# Patient Record
Sex: Female | Born: 1985 | Race: Black or African American | Hispanic: No | Marital: Single | Smoking: Current every day smoker
Health system: Southern US, Community
[De-identification: ages and names within clinical notes are randomized; demographics above are authoritative.]

## PROBLEM LIST (undated history)

## (undated) ENCOUNTER — Inpatient Hospital Stay (HOSPITAL_COMMUNITY): Payer: Self-pay

## (undated) DIAGNOSIS — I10 Essential (primary) hypertension: Secondary | ICD-10-CM

## (undated) DIAGNOSIS — D649 Anemia, unspecified: Secondary | ICD-10-CM

## (undated) DIAGNOSIS — A599 Trichomoniasis, unspecified: Secondary | ICD-10-CM

## (undated) HISTORY — PX: WISDOM TOOTH EXTRACTION: SHX21

## (undated) HISTORY — DX: Anemia, unspecified: D64.9

## (undated) HISTORY — PX: NO PAST SURGERIES: SHX2092

## (undated) HISTORY — DX: Essential (primary) hypertension: I10

## (undated) HISTORY — DX: Trichomoniasis, unspecified: A59.9

---

## 2010-01-21 NOTE — L&D Delivery Note (Addendum)
Delivery Note At  a non-viable female sex was delivered via  En caul.  APGAR: 0,0 ; weight to be determined   Placenta status:  Intact Patient delivered intact the placenta with membranes surrounding baby.  Baby had nuchal x 1 but otherwise had no maceration, consistent with very recent fetal demise. Patient with lower blood counts now and platelet count is also dropping consistent with possible DIC.  Will transfuse 2u PRBC, keep 2 available.  So far have given Methergine 0.2 mg IM, Cytotec 800 micrograms rectally, and now Hemabate 4 amps(1 mg total) IM    May need to transfuse platelets and coag factors if bleeding does not abate.  Blood pressure 131/71, pulse 66, temperature 98.3 F (36.8 C), temperature source Oral, resp. rate 20, height 5\' 5"  (1.651 m), weight 58.786 kg (129 lb 9.6 oz).   Basename 08/20/10 0137 08/19/10 1749  HGB 6.7* 8.2*  HCT 19.2* 23.8*  PLT 72* 110*       Anesthesia: None  Episiotomy: none  Lacerations: none Suture Repair: none Est. Blood Loss (mL): 500 cc retroplacental clot, 250 cc after delivery of fetus  Mom to 3rd floor.  Baby to NA.  Emmette Katt H 08/20/2010, 2:34 AM

## 2010-08-19 ENCOUNTER — Inpatient Hospital Stay (HOSPITAL_COMMUNITY)
Admission: AD | Admit: 2010-08-19 | Discharge: 2010-08-22 | DRG: 774 | Disposition: A | Discharge status: Home / Self Care | Payer: Medicaid Other | Source: Ambulatory Visit | Attending: Obstetrics & Gynecology | Admitting: Obstetrics & Gynecology

## 2010-08-19 ENCOUNTER — Encounter (HOSPITAL_COMMUNITY): Payer: Self-pay | Admitting: *Deleted

## 2010-08-19 ENCOUNTER — Inpatient Hospital Stay (HOSPITAL_COMMUNITY): Payer: Medicaid Other

## 2010-08-19 DIAGNOSIS — O9912 Other diseases of the blood and blood-forming organs and certain disorders involving the immune mechanism complicating childbirth: Secondary | ICD-10-CM | POA: Diagnosis present

## 2010-08-19 DIAGNOSIS — O4590 Premature separation of placenta, unspecified, unspecified trimester: Secondary | ICD-10-CM | POA: Diagnosis present

## 2010-08-19 DIAGNOSIS — O364XX0 Maternal care for intrauterine death, not applicable or unspecified: Principal | ICD-10-CM | POA: Diagnosis present

## 2010-08-19 DIAGNOSIS — O0930 Supervision of pregnancy with insufficient antenatal care, unspecified trimester: Secondary | ICD-10-CM

## 2010-08-19 DIAGNOSIS — O9903 Anemia complicating the puerperium: Secondary | ICD-10-CM | POA: Diagnosis not present

## 2010-08-19 DIAGNOSIS — D649 Anemia, unspecified: Secondary | ICD-10-CM | POA: Diagnosis not present

## 2010-08-19 DIAGNOSIS — D689 Coagulation defect, unspecified: Secondary | ICD-10-CM | POA: Diagnosis present

## 2010-08-19 DIAGNOSIS — D65 Disseminated intravascular coagulation [defibrination syndrome]: Secondary | ICD-10-CM | POA: Diagnosis present

## 2010-08-19 DIAGNOSIS — N939 Abnormal uterine and vaginal bleeding, unspecified: Secondary | ICD-10-CM

## 2010-08-19 LAB — WET PREP, GENITAL

## 2010-08-19 LAB — DIFFERENTIAL
Basophils Absolute: 0 10*3/uL (ref 0.0–0.1)
Lymphs Abs: 0.8 10*3/uL (ref 0.7–4.0)
Monocytes Absolute: 1 10*3/uL (ref 0.1–1.0)
WBC Morphology: INCREASED

## 2010-08-19 LAB — CBC
MCH: 32.2 pg (ref 26.0–34.0)
MCHC: 34.5 g/dL (ref 30.0–36.0)
MCV: 93.3 fL (ref 78.0–100.0)
Platelets: 110 10*3/uL — ABNORMAL LOW (ref 150–400)
RDW: 12.8 % (ref 11.5–15.5)

## 2010-08-19 LAB — URINALYSIS, ROUTINE W REFLEX MICROSCOPIC
Bilirubin Urine: NEGATIVE
Ketones, ur: 15 mg/dL — AB
Nitrite: NEGATIVE
Specific Gravity, Urine: 1.025 (ref 1.005–1.030)
Urobilinogen, UA: 1 mg/dL (ref 0.0–1.0)

## 2010-08-19 LAB — ABO/RH
ABO/RH(D): O POS
RH Type: POSITIVE

## 2010-08-19 LAB — RPR
RPR Ser Ql: NONREACTIVE
RPR: NONREACTIVE

## 2010-08-19 LAB — RAPID URINE DRUG SCREEN, HOSP PERFORMED: Amphetamines: NOT DETECTED

## 2010-08-19 LAB — URINE MICROSCOPIC-ADD ON

## 2010-08-19 MED ORDER — LIDOCAINE HCL (PF) 1 % IJ SOLN
30.0000 mL | INTRAMUSCULAR | Status: DC | PRN
  Filled 2010-08-19: qty 30

## 2010-08-19 MED ORDER — MISOPROSTOL 200 MCG PO TABS
200.0000 ug | ORAL_TABLET | ORAL | Status: DC
  Administered 2010-08-19 – 2010-08-20 (×2): 200 ug via VAGINAL
  Filled 2010-08-19 (×10): qty 1

## 2010-08-19 MED ORDER — LACTATED RINGERS IV SOLN
INTRAVENOUS | Status: DC
  Administered 2010-08-19 – 2010-08-21 (×5): via INTRAVENOUS

## 2010-08-19 MED ORDER — CITRIC ACID-SODIUM CITRATE 334-500 MG/5ML PO SOLN: 30.0000 mL | ORAL | Status: DC | PRN

## 2010-08-19 MED ORDER — HYDROXYZINE HCL 50 MG PO TABS: 50.0000 mg | ORAL_TABLET | Freq: Four times a day (QID) | ORAL | Status: DC | PRN

## 2010-08-19 MED ORDER — FENTANYL CITRATE 0.05 MG/ML IJ SOLN
100.0000 ug | INTRAMUSCULAR | Status: DC | PRN
  Administered 2010-08-19 – 2010-08-20 (×4): 100 ug via INTRAVENOUS
  Filled 2010-08-19 (×4): qty 2

## 2010-08-19 MED ORDER — HYDROXYZINE HCL 50 MG/ML IM SOLN
50.0000 mg | Freq: Four times a day (QID) | INTRAMUSCULAR | Status: DC | PRN
  Filled 2010-08-19: qty 1

## 2010-08-19 MED ORDER — ONDANSETRON HCL 4 MG/2ML IJ SOLN
4.0000 mg | Freq: Four times a day (QID) | INTRAMUSCULAR | Status: DC | PRN
  Administered 2010-08-20: 4 mg via INTRAVENOUS
  Filled 2010-08-19: qty 2

## 2010-08-19 MED ORDER — LACTATED RINGERS IV SOLN: 500.0000 mL | INTRAVENOUS | Status: DC | PRN

## 2010-08-19 MED ORDER — FLEET ENEMA 7-19 GM/118ML RE ENEM: 1.0000 | ENEMA | RECTAL | Status: DC | PRN

## 2010-08-19 MED ORDER — FENTANYL CITRATE 0.05 MG/ML IJ SOLN: 100.0000 ug | INTRAMUSCULAR | Status: DC | PRN

## 2010-08-19 MED ORDER — OXYCODONE-ACETAMINOPHEN 5-325 MG PO TABS
2.0000 | ORAL_TABLET | ORAL | Status: DC | PRN
  Administered 2010-08-21: 2 via ORAL

## 2010-08-19 MED ORDER — OXYTOCIN 20 UNITS IN LACTATED RINGERS INFUSION - SIMPLE
125.0000 mL/h | Freq: Once | INTRAVENOUS | Status: DC
  Administered 2010-08-20: 500 mL/h via INTRAVENOUS
  Filled 2010-08-19: qty 1000

## 2010-08-19 MED ORDER — ACETAMINOPHEN 325 MG PO TABS
650.0000 mg | ORAL_TABLET | ORAL | Status: DC | PRN
  Filled 2010-08-19 (×3): qty 2

## 2010-08-19 MED ORDER — IBUPROFEN 600 MG PO TABS
600.0000 mg | ORAL_TABLET | Freq: Four times a day (QID) | ORAL | Status: DC | PRN
  Administered 2010-08-22: 600 mg via ORAL
  Filled 2010-08-19: qty 1

## 2010-08-19 NOTE — Progress Notes (Signed)
  Cytotec 250 micrograms placed in vagina  cx still about the same  Bleeding not heavy  Continue current care plan.  Lazaro Arms 08/19/2010 8:53 PM

## 2010-08-19 NOTE — Progress Notes (Signed)
Pt c/o lg amt of bleeding that was running down her legs about 1 hr ago.   Pt also c/o severe LLQ pain that radiates around to low back

## 2010-08-19 NOTE — Progress Notes (Signed)
Pain medication follow-up
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
## 2010-08-19 NOTE — H&P (Signed)
Regina Newman is a 25 y.o. female G1P1001 with IUP at Unknown presenting with c/o severe lower abdominal pain and heavy bleeding (ran down her legs) since this morning. Pt states she has been having severe pain all day but bleeding started recently.  Did not know she was pregnant, but suspected she was. Did not do a Preg test or get care "because I didn't want anyone to know".  No fever or other symptoms. Denies significant medical history.     Prenatal Course Source of Care:none   Past Medical History: No past medical history on file.  Past Surgical History:  Past Surgical History  Procedure Date  . No past surgeries     Obstetrical History:  OB History    Grav Para Term Preterm Abortions TAB SAB Ect Mult Living   1 1 1       1       Social History:  History   Social History  . Marital Status: Single    Spouse Name: N/A    Number of Children: N/A  . Years of Education: N/A   Social History Main Topics  . Smoking status: Current Everyday Smoker -- 0.2 packs/day  . Smokeless tobacco: None  . Alcohol Use: Yes     few days a week  . Drug Use: Yes    Special: Marijuana  . Sexually Active:    Other Topics Concern  . None   Social History Narrative  . None    Family History: History reviewed. No pertinent family history.  Medications:  Prenatal vitamins,  No current facility-administered medications for this encounter.    Allergies: No Known Allergies  Review of Systems: - Negative except vaginal bleeding and abdominal pain  Physical Exam: Blood pressure 143/86, pulse 85, temperature 99.2 F (37.3 C), temperature source Oral, resp. rate 20, last menstrual period 04/22/2010. General: NAD Heart: RRR, no murmurs Lungs: CTA b/l  Abd: Gravid in contour, rigid and tender;   Fundal height about 22-24cm Ext: no edema Neuro: DTRs normal Other:   Speculum Exam: Moderate to large blood in vault.  Cultures obtained Digital Cervical Exam: Dilatation 1+   Effacement  80%  Station -3  Presentation ?vtx FHR:  Baseline rate 0    Contractions: Frequent   Blood pressure 143/86, pulse 85, temperature 99.2 F (37.3 C), temperature source Oral, resp. rate 20, last menstrual period 04/22/2010.  unsure   Dilation: 1 Effacement (%): 80 Station: -3 Exam by:: Artelia Laroche CNM  Pertinent Labs/Studies:  Pending Bedside U/S:  IUFD with large placental abruption (10 cm) Assessment : Regina Newman is a 25 y.o. female presenting for Heavy vaginal bleeding and abdominal pain IUP @ approximately 23 weeks IUFD with placental abruption  Plan: Admit to [facility] , Dr Despina Hidden notified Routine orders, Full labs + DIC Induction of Labor Anticipate SVD   [facility] 08/19/2010, 5:51 PM

## 2010-08-20 ENCOUNTER — Other Ambulatory Visit: Payer: Self-pay | Admitting: Obstetrics & Gynecology

## 2010-08-20 ENCOUNTER — Encounter (HOSPITAL_COMMUNITY): Payer: Self-pay | Admitting: *Deleted

## 2010-08-20 ENCOUNTER — Encounter (HOSPITAL_COMMUNITY): Payer: Self-pay | Admitting: Anesthesiology

## 2010-08-20 DIAGNOSIS — D689 Coagulation defect, unspecified: Secondary | ICD-10-CM

## 2010-08-20 DIAGNOSIS — D65 Disseminated intravascular coagulation [defibrination syndrome]: Secondary | ICD-10-CM

## 2010-08-20 DIAGNOSIS — O4590 Premature separation of placenta, unspecified, unspecified trimester: Secondary | ICD-10-CM

## 2010-08-20 DIAGNOSIS — O9912 Other diseases of the blood and blood-forming organs and certain disorders involving the immune mechanism complicating childbirth: Secondary | ICD-10-CM

## 2010-08-20 DIAGNOSIS — O364XX0 Maternal care for intrauterine death, not applicable or unspecified: Secondary | ICD-10-CM

## 2010-08-20 LAB — CBC
HCT: 19.2 % — ABNORMAL LOW (ref 36.0–46.0)
HCT: 23.4 % — ABNORMAL LOW (ref 36.0–46.0)
HCT: 22.7 % — ABNORMAL LOW (ref 36.0–46.0)
Hemoglobin: 6.7 g/dL — CL (ref 12.0–15.0)
Hemoglobin: 8 g/dL — ABNORMAL LOW (ref 12.0–15.0)
Hemoglobin: 7.8 g/dL — ABNORMAL LOW (ref 12.0–15.0)
MCH: 32.4 pg (ref 26.0–34.0)
MCH: 30.5 pg (ref 26.0–34.0)
MCHC: 34.9 g/dL (ref 30.0–36.0)
MCHC: 34.2 g/dL (ref 30.0–36.0)
MCHC: 34.4 g/dL (ref 30.0–36.0)
MCV: 92.8 fL (ref 78.0–100.0)
RBC: 2.62 MIL/uL — ABNORMAL LOW (ref 3.87–5.11)

## 2010-08-20 LAB — COMPREHENSIVE METABOLIC PANEL
Albumin: 2.3 g/dL — ABNORMAL LOW (ref 3.5–5.2)
BUN: 3 mg/dL — ABNORMAL LOW (ref 6–23)
Creatinine, Ser: <0.47 mg/dL — ABNORMAL LOW (ref 0.50–1.10)
Potassium: 3.2 mEq/L — ABNORMAL LOW (ref 3.5–5.1)
Total Protein: 5.1 g/dL — ABNORMAL LOW (ref 6.0–8.3)

## 2010-08-20 LAB — RAPID HIV SCREEN (WH-MAU): Rapid HIV Screen: NONREACTIVE

## 2010-08-20 LAB — GC/CHLAMYDIA PROBE AMP, GENITAL
Chlamydia, DNA Probe: NEGATIVE
GC Probe Amp, Genital: NEGATIVE

## 2010-08-20 LAB — PREPARE RBC (CROSSMATCH)

## 2010-08-20 LAB — DIC (DISSEMINATED INTRAVASCULAR COAGULATION)PANEL
D-Dimer, Quant: >20 ug/mL-FEU — ABNORMAL HIGH (ref 0.00–0.48)
D-Dimer, Quant: 5.2 ug/mL-FEU — ABNORMAL HIGH (ref 0.00–0.48)
INR: 1.13 (ref 0.00–1.49)
Platelets: 62 10*3/uL — ABNORMAL LOW (ref 150–400)
Platelets: 53 10*3/uL — ABNORMAL LOW (ref 150–400)
aPTT: 30 seconds (ref 24–37)

## 2010-08-20 MED ORDER — LACTATED RINGERS IV SOLN: 500.0000 mL | Freq: Once | INTRAVENOUS | Status: DC

## 2010-08-20 MED ORDER — IBUPROFEN 600 MG PO TABS
600.0000 mg | ORAL_TABLET | Freq: Four times a day (QID) | ORAL | Status: DC
  Administered 2010-08-21: 600 mg via ORAL
  Filled 2010-08-20: qty 1

## 2010-08-20 MED ORDER — METHYLERGONOVINE MALEATE 0.2 MG/ML IJ SOLN
INTRAMUSCULAR | Status: AC
  Administered 2010-08-20: 0.2 mg
  Filled 2010-08-20: qty 1

## 2010-08-20 MED ORDER — ZOLPIDEM TARTRATE 5 MG PO TABS
5.0000 mg | ORAL_TABLET | Freq: Every evening | ORAL | Status: DC | PRN
  Administered 2010-08-22: 5 mg via ORAL
  Filled 2010-08-20: qty 1

## 2010-08-20 MED ORDER — DIPHENHYDRAMINE HCL 50 MG/ML IJ SOLN
25.0000 mg | Freq: Once | INTRAMUSCULAR | Status: AC
  Administered 2010-08-20: 25 mg via INTRAVENOUS

## 2010-08-20 MED ORDER — SODIUM CHLORIDE 0.9 % IV SOLN
INTRAVENOUS | Status: DC
  Administered 2010-08-20: 10:00:00 via INTRAVENOUS

## 2010-08-20 MED ORDER — METHYLERGONOVINE MALEATE 0.2 MG/ML IJ SOLN: 0.2000 mg | INTRAMUSCULAR | Status: DC | PRN

## 2010-08-20 MED ORDER — METHYLERGONOVINE MALEATE 0.2 MG PO TABS: 0.2000 mg | ORAL_TABLET | ORAL | Status: DC | PRN

## 2010-08-20 MED ORDER — OXYTOCIN 20 UNITS IN LACTATED RINGERS INFUSION - SIMPLE: 125.0000 mL/h | INTRAVENOUS | Status: DC

## 2010-08-20 MED ORDER — FUROSEMIDE 10 MG/ML IJ SOLN
20.0000 mg | Freq: Once | INTRAMUSCULAR | Status: AC
  Administered 2010-08-20: 20 mg via INTRAVENOUS
  Filled 2010-08-20: qty 2

## 2010-08-20 MED ORDER — DIPHENOXYLATE-ATROPINE 2.5-0.025 MG PO TABS
2.0000 | ORAL_TABLET | ORAL | Status: AC | PRN
  Administered 2010-08-20: 2 via ORAL
  Filled 2010-08-20: qty 2

## 2010-08-20 MED ORDER — ACETAMINOPHEN 325 MG PO TABS
650.0000 mg | ORAL_TABLET | Freq: Once | ORAL | Status: AC
  Administered 2010-08-20: 650 mg via ORAL

## 2010-08-20 MED ORDER — BENZOCAINE-MENTHOL 20-0.5 % EX AERO: 1.0000 "application " | INHALATION_SPRAY | CUTANEOUS | Status: DC | PRN

## 2010-08-20 MED ORDER — PHENYLEPHRINE 40 MCG/ML (10ML) SYRINGE FOR IV PUSH (FOR BLOOD PRESSURE SUPPORT)
80.0000 ug | PREFILLED_SYRINGE | INTRAVENOUS | Status: DC | PRN
  Filled 2010-08-20: qty 5

## 2010-08-20 MED ORDER — SODIUM CHLORIDE 0.9 % IV SOLN: INTRAVENOUS | Status: DC

## 2010-08-20 MED ORDER — DIPHENHYDRAMINE HCL 50 MG/ML IJ SOLN
12.5000 mg | INTRAMUSCULAR | Status: DC | PRN
  Filled 2010-08-20: qty 1

## 2010-08-20 MED ORDER — WITCH HAZEL-GLYCERIN EX PADS: MEDICATED_PAD | CUTANEOUS | Status: DC | PRN

## 2010-08-20 MED ORDER — OXYTOCIN 20 UNITS IN LACTATED RINGERS INFUSION - SIMPLE
250.0000 mL/h | INTRAVENOUS | Status: DC
  Administered 2010-08-20: 250 mL/h via INTRAVENOUS
  Administered 2010-08-20: 500 mL/h via INTRAVENOUS
  Administered 2010-08-20: 250 mL/h via INTRAVENOUS
  Filled 2010-08-20 (×4): qty 1000

## 2010-08-20 MED ORDER — TETANUS-DIPHTH-ACELL PERTUSSIS 5-2.5-18.5 LF-MCG/0.5 IM SUSP
0.5000 mL | Freq: Once | INTRAMUSCULAR | Status: AC
  Administered 2010-08-21: 0.5 mL via INTRAMUSCULAR
  Filled 2010-08-20: qty 0.5

## 2010-08-20 MED ORDER — PRENATAL PLUS 27-1 MG PO TABS
1.0000 | ORAL_TABLET | Freq: Every day | ORAL | Status: DC
  Administered 2010-08-22: 1 via ORAL
  Filled 2010-08-20: qty 1

## 2010-08-20 MED ORDER — DIPHENHYDRAMINE HCL 25 MG PO CAPS: 25.0000 mg | ORAL_CAPSULE | Freq: Four times a day (QID) | ORAL | Status: DC | PRN

## 2010-08-20 MED ORDER — SENNOSIDES-DOCUSATE SODIUM 8.6-50 MG PO TABS
1.0000 | ORAL_TABLET | Freq: Every day | ORAL | Status: DC
  Administered 2010-08-21: 1 via ORAL

## 2010-08-20 MED ORDER — ACETAMINOPHEN 650 MG RE SUPP
650.0000 mg | Freq: Once | RECTAL | Status: AC
  Administered 2010-08-20: 650 mg via RECTAL
  Filled 2010-08-20: qty 1

## 2010-08-20 MED ORDER — CARBOPROST TROMETHAMINE 250 MCG/ML IM SOLN
1000.0000 ug | Freq: Once | INTRAMUSCULAR | Status: AC
  Administered 2010-08-20: 1000 ug via INTRAMUSCULAR
  Filled 2010-08-20: qty 4

## 2010-08-20 MED ORDER — MISOPROSTOL 200 MCG PO TABS
800.0000 ug | ORAL_TABLET | Freq: Once | ORAL | Status: AC
  Administered 2010-08-20: 800 ug via RECTAL

## 2010-08-20 MED ORDER — DIPHENHYDRAMINE HCL 25 MG PO CAPS
25.0000 mg | ORAL_CAPSULE | Freq: Once | ORAL | Status: AC
  Administered 2010-08-20: 25 mg via ORAL
  Filled 2010-08-20: qty 1

## 2010-08-20 MED ORDER — SIMETHICONE 80 MG PO CHEW: 80.0000 mg | CHEWABLE_TABLET | ORAL | Status: DC | PRN

## 2010-08-20 MED ORDER — OXYCODONE-ACETAMINOPHEN 5-325 MG PO TABS
1.0000 | ORAL_TABLET | ORAL | Status: DC | PRN
  Administered 2010-08-22: 2 via ORAL
  Administered 2010-08-22 (×2): 1 via ORAL
  Filled 2010-08-20: qty 1
  Filled 2010-08-20 (×2): qty 2
  Filled 2010-08-20: qty 1

## 2010-08-20 MED ORDER — ONDANSETRON HCL 4 MG/2ML IJ SOLN: 4.0000 mg | INTRAMUSCULAR | Status: DC | PRN

## 2010-08-20 MED ORDER — ONDANSETRON HCL 4 MG PO TABS
4.0000 mg | ORAL_TABLET | ORAL | Status: DC | PRN
  Administered 2010-08-21 – 2010-08-22 (×2): 4 mg via ORAL
  Filled 2010-08-20 (×2): qty 1

## 2010-08-20 MED ORDER — FENTANYL 2.5 MCG/ML BUPIVACAINE 1/10 % EPIDURAL INFUSION (WH - ANES): 14.0000 mL/h | INTRAMUSCULAR | Status: DC

## 2010-08-20 MED ORDER — ACETAMINOPHEN 325 MG PO TABS: 650.0000 mg | ORAL_TABLET | Freq: Once | ORAL | Status: DC

## 2010-08-20 MED ORDER — EPHEDRINE 5 MG/ML INJ
10.0000 mg | INTRAVENOUS | Status: DC | PRN
  Filled 2010-08-20: qty 4

## 2010-08-20 NOTE — Progress Notes (Signed)
Comfort measures provided. Patient told to call if needed.

## 2010-08-20 NOTE — Progress Notes (Signed)
Dr. Despina Hidden notified of patients increasing temperature. No new orders received at this time.

## 2010-08-20 NOTE — Anesthesia Preprocedure Evaluation (Signed)
Anesthesia Evaluation  Name, MR# and DOB Patient awake  General Assessment Comment  Reviewed: Allergy & Precautions, H&P , Patient's Chart, lab work & pertinent test results and reviewed documented beta blocker date and time   Airway       Dental   Pulmonary      Cardiovascular    Neuro/PsychNegative Neurological ROS Negative Psych ROS  GI/Hepatic/Renal negative GI ROS, negative Liver ROS, and negative Renal ROS (+)       Endo/Other  Negative Endocrine ROS (+)   Abdominal   Musculoskeletal  Hematology Platelet count 110 - unknown etiology   Peds  Reproductive/Obstetrics (+) Pregnancy (23 week IUFD, no prenatal care)   Anesthesia Other Findings Smoker, +marijuana use 23 week IUFD, no prenatal care            Anesthesia Physical Anesthesia Plan  ASA: II  Anesthesia Plan: Epidural   Post-op Pain Management:    Induction:   Airway Management Planned:   Additional Equipment:   Intra-op Plan:   Post-operative Plan:   Informed Consent: I have reviewed the patients History and Physical, chart, labs and discussed the procedure including the risks, benefits and alternatives for the proposed anesthesia with the patient or authorized representative who has indicated his/her understanding and acceptance.     Plan Discussed with:   Anesthesia Plan Comments:         Anesthesia Quick Evaluation

## 2010-08-20 NOTE — Progress Notes (Signed)
Bleeding from uterus since 3 am has been minimal and uterus has been firm. Patient just now with 2nd unit of blood in, will check CBC now mostly to check platelet count.  DIC panel has returned and reveals what we knew was going on, a consumptive coagulopathy secondary to abruption and resorption of large retroplacental clot.   PT 19 PTT  37 Fibrinogen  59 D dimer  >20  A unit of cryoprecipitate has been ordered and will be transfused, but clinically for now pt is stable and DIC seems to be reversed.  Will transfuse platelets if less than 50k.  Regina Newman H 08/20/2010 6:34 AM

## 2010-08-20 NOTE — Progress Notes (Signed)
Patient without complaints. Bleeding minimal now for 5 hours.  Blood pressure 135/61, pulse 69, temperature 99.6 F (37.6 C), temperature source Axillary, resp. rate 20, height 5\' 5"  (1.651 m), weight 58.786 kg (129 lb 9.6 oz), SpO2 93.00%. UOP since lasix 500 cc.  Abdomen soft, uterus firm and nontender, no bleeding  Hemoglobin  Date/Time Value Range Status  08/20/2010  6:25 AM 8.0* 12.0-15.0 (g/dL) Final  04/13/4008  2:72 AM 6.7* 12.0-15.0 (g/dL) Final     REPEATED TO VERIFY     CRITICAL RESULT CALLED TO, READ BACK BY AND VERIFIED WITH:     DUPONT,M ON [id number] AT 0203 BY PATEL,S.     DELTA CHECK NOTED  08/19/2010  5:49 PM 8.2* 12.0-15.0 (g/dL) Final     HCT  Date/Time Value Range Status  08/20/2010  6:25 AM 23.4* 36.0-46.0 (%) Final  08/20/2010  1:37 AM 19.2* 36.0-46.0 (%) Final  08/19/2010  5:49 PM 23.8* 36.0-46.0 (%) Final     Platelets  Date/Time Value Range Status  08/20/2010  6:25 AM 54* 150-400 (K/uL) Final     REPEATED TO VERIFY     PLATELET COUNT CONFIRMED BY SMEAR     CONSISTENT WITH PREVIOUS RESULT  08/20/2010  3:07 AM 62* 150-400 (K/uL) Final     REPEATED TO VERIFY     CONSISTENT WITH PREVIOUS RESULT  08/20/2010  1:37 AM 72* 150-400 (K/uL) Final     SPECIMEN CHECKED FOR CLOTS     REPEATED TO VERIFY     DELTA CHECK NOTED     PLATELET COUNT CONFIRMED BY SMEAR  08/19/2010  5:49 PM 110* 150-400 (K/uL) Final     SPECIMEN CHECKED FOR CLOTS     PLATELET COUNT CONFIRMED BY SMEAR     REPEATED TO VERIFY    Currently patient receiving her 10 units of cryoprecipitate.  Will transfuse 1 more unit PRBC.  Platelets are staying pretty steady  Assessment: 1.  IUFD secondary to abruption :  Delivered 2.  Large retroplacental clot + abruption leading to consumptive coagulopathy/DIC:  Process seems to have abated.  Status post 2 U PRBC, to get 1 more, getting 10 units of cryo for fibrinogen 59 etc. 3.  Will transfer to AICU with patient now more stable.

## 2010-08-20 NOTE — Progress Notes (Signed)
Regina Newman is a 25 y.o. G2P1001 at [redacted]w[redacted]d by ultrasound admitted for induction of labor due to IUFD.  Subjective: Low abd cramping  Objective: BP 141/79  Pulse 73  Temp(Src) 98.3 F (36.8 C) (Oral)  Resp 20  Ht 5\' 5"  (1.651 m)  Wt 58.786 kg (129 lb 9.6 oz)  BMI 21.57 kg/m2   I/O this shift: In: 2221.7 [P.O.:1680; I.V.:541.7] Out: 0    SVE:   2/80/parts in cervix/0  Labs: Lab Results  Component Value Date   WBC 20.1* 08/19/2010   HGB 8.2* 08/19/2010   HCT 23.8* 08/19/2010   MCV 93.3 08/19/2010   PLT 110* 08/19/2010    Assessment / Plan: IOL due to IUFD. Cytotec #2 placed vaginally  Labor: Progressing normally Preeclampsia:  labs stable Pain Control:  Fentanyl I/D:  n/a Anticipated MOD:  NSVD  Travell Desaulniers E. 08/20/2010, 12:26 AM

## 2010-08-20 NOTE — Progress Notes (Signed)
Dr Despina Hidden remains at bedside, lab calls and gives criticial lab values, md made aware, md to order type and screen and cross match

## 2010-08-21 LAB — RPR: RPR Ser Ql: NONREACTIVE

## 2010-08-21 LAB — PREPARE CRYOPRECIPITATE
Unit division: 0
Unit division: 0
Unit division: 0
Unit division: 0
Unit division: 0

## 2010-08-21 LAB — TYPE AND SCREEN
Unit division: 0
Unit division: 0

## 2010-08-21 LAB — CBC
HCT: 21.4 % — ABNORMAL LOW (ref 36.0–46.0)
Hemoglobin: 7.4 g/dL — ABNORMAL LOW (ref 12.0–15.0)
RBC: 2.39 MIL/uL — ABNORMAL LOW (ref 3.87–5.11)
WBC: 9.8 10*3/uL (ref 4.0–10.5)

## 2010-08-21 LAB — STREP B DNA PROBE: Strep Group B Ag: NEGATIVE

## 2010-08-21 MED ORDER — MISOPROSTOL 200 MCG PO TABS: 200.0000 ug | ORAL_TABLET | ORAL | Status: DC | PRN

## 2010-08-21 NOTE — Progress Notes (Signed)
Patient has good pain control, not much vaginal bleeding.  BP118/52  p 71  T 98.8  NAD, reserved affect  ABD  NT, FF  EXT no cords, not tender  HB 7.4  PLT 62  IMP/plan  PPD 2 s/p SVD, abruption, fetal demise                 Anemia, low platelets                  Will repeat CBC in AM.                 Transfer to [facility]

## 2010-08-21 NOTE — Anesthesia Procedure Notes (Signed)
Procedures

## 2010-08-21 NOTE — Progress Notes (Signed)
UR chart review completed.  

## 2010-08-21 NOTE — Progress Notes (Signed)
Spiritual Care - Referral from RN.  Visited at 9:30 am to offer emotional/spiritual support to patient with fetal demise.  Expressed by sorrow for her loss.  Patient was not very expressive,  She did not see nor hold her baby.  I explained need to make arrangements for the baby, and she said she is waiting for her sister to come and help her.  Seems to be very dependent on the sister.  I did review with her the options available as burial, cremation, service, etc.  She expects sister to come today and wants to wait to discuss this.  Dory Horn, Chaplain

## 2010-08-22 LAB — CBC
Hemoglobin: 7.6 g/dL — ABNORMAL LOW (ref 12.0–15.0)
MCH: 30 pg (ref 26.0–34.0)
MCV: 90.5 fL (ref 78.0–100.0)
RBC: 2.53 MIL/uL — ABNORMAL LOW (ref 3.87–5.11)

## 2010-08-22 MED ORDER — IBUPROFEN 800 MG PO TABS: 800.0000 mg | ORAL_TABLET | Freq: Three times a day (TID) | ORAL | Status: AC | PRN

## 2010-08-22 MED ORDER — DOCUSATE SODIUM 100 MG PO CAPS: 100.0000 mg | ORAL_CAPSULE | Freq: Two times a day (BID) | ORAL | Status: AC

## 2010-08-22 NOTE — Progress Notes (Signed)
  Post Partum Day 2, s/p IUFD Subjective: up ad lib, voiding, tolerating PO and some dizziness with walking, pain well controlled  Objective: Blood pressure 133/82, pulse 61, temperature 97.7 F (36.5 C), temperature source Oral, resp. rate 19, height 5\' 7"  (1.702 m), weight 136 lb (61.689 kg), SpO2 100.00%.  Physical Exam:  General: alert, cooperative and no distress Lochia: appropriate Uterine Fundus: firm, below umbilicus DVT Evaluation: No evidence of DVT seen on physical exam. Negative Homan's sign. Cardiac - RRR no murmurs Lungs - CTAB  Basename 08/22/10 0536 08/21/10 0550  HGB 7.6* 7.4*  HCT 22.9* 21.4*    Assessment/Plan: Discharge home, s/p IUFD with no PNC.  Will check a CBC today prior to discharge.   LOS: 3 days   Regina Newman 08/22/2010, 6:51 AM

## 2010-08-22 NOTE — Progress Notes (Signed)
SW met with pt this morning to offer resources and assist with funeral arrangements.  Pt was tearful and did not want to have discussion.  She asked that this SW to contact her sister, Halston Fairclough to discuss details.  SW offered emotional support to the pt and brochure for [employer] as a resource.  Pt is not interested in counseling at this time. SW spoke to pt's sister who expressed interest in having the infant cremated.  SW spoke with pt and she agrees with sister's decision.  SW called [employer] administrator and requested that paper work be sent for pt to complete.  This will be a free service to the family.  Pt advised that FOB will need to sign paper work as well, as per [place] at [employer].  Pt's is not interested in having a service or seeing the infant.  SW will complete paper work with pt, once received and request that infant be picked up.

## 2010-08-22 NOTE — Discharge Summary (Signed)
  Obstetric Discharge Summary Reason for Admission: Labor, IUFD at 23 weeks, no prenatal care Prenatal Procedures: ultrasound Intrapartum Procedures: spontaneous vaginal delivery Postpartum Procedures: transfusion 3 units PRBC, 1 unit cryoprecipitate Complications-Operative and Postpartum: hemorrhage Hemoglobin  Date Value Range Status  August 26, 2010 7.6* 12.0-15.0 (g/dL) Final     HCT  Date Value Range Status  August 26, 2010 22.9* 36.0-46.0 (%) Final     Discharge Diagnoses: Intrauterine fetal demise  Hospital Course: Patient presented with abdominal pain and bleeding and was found to have an IUFD with a retroplacental clot secondary to placental abruption.  She delivered at 23wk fetus with Apgars 0/0.  She hada post partum hemorrhage and required 0.2IM methergine, cytotec PR and 4 amps of hemabate.  Her Hgb dropped from 8.2 to 6.7 and her platelets dropped to 53.  She received 3 units PRBS and 1 unit of cryoprecipitate.  On PPD 2, her Hgb was stable at 7.6 and her platelets were 83.  She is unsure of contraception at this time and will follow up with [facility] clinic.  Discharge Information: Date: 08/26/10 Activity: pelvic rest Diet: routine Medications: Ibuprophen and Colace Condition: stable Instructions: refer to practice specific booklet Discharge to: home Follow-up Information    Follow up with [facility] in 1 week. (follow up appointment)    Contact information:   [address] [place] [address]          Newborn Data: Live born unspecified sex  Birth Weight: 1 lb 1.8 oz (505 g) APGAR: 0, 0  Infant deceased.   Marena Chancy 08/26/10, 6:58 AM

## 2010-08-22 NOTE — Progress Notes (Signed)
SW received paper work from [employer].  [facility] Candelaria Stagers is assisting pt with completion of paper work and will fax back to agency.

## 2010-08-22 NOTE — Progress Notes (Signed)
SW spoke with chaplin.  Paper work was completed and faxed.  SW will assist further if need prior to d/c.

## 2010-08-27 NOTE — Discharge Summary (Signed)
Agree with above note.  Romolo Sieling A 08/27/2010 1:16 PM

## 2011-10-02 ENCOUNTER — Emergency Department (INDEPENDENT_AMBULATORY_CARE_PROVIDER_SITE_OTHER)
Admission: EM | Admit: 2011-10-02 | Discharge: 2011-10-02 | Disposition: A | Discharge status: Home / Self Care | Payer: Medicaid Other | Source: Home / Self Care | Attending: Emergency Medicine | Admitting: Emergency Medicine

## 2011-10-02 ENCOUNTER — Encounter (HOSPITAL_COMMUNITY): Payer: Self-pay | Admitting: Emergency Medicine

## 2011-10-02 DIAGNOSIS — J020 Streptococcal pharyngitis: Secondary | ICD-10-CM

## 2011-10-02 MED ORDER — PENICILLIN V POTASSIUM 500 MG PO TABS: 500.0000 mg | ORAL_TABLET | Freq: Four times a day (QID) | ORAL | Status: AC

## 2011-10-02 MED ORDER — HYDROCODONE-ACETAMINOPHEN 7.5-500 MG/15ML PO SOLN: 15.0000 mL | Freq: Four times a day (QID) | ORAL | Status: AC | PRN

## 2011-10-02 NOTE — ED Notes (Signed)
Call from pharmacy to ; their stock item lortab contains 325 tylenol product instead of 500; per Dr Ladon Applebaum , substitution is alllowed

## 2011-10-02 NOTE — ED Provider Notes (Signed)
History     CSN: [id number]  Arrival date & time 10/02/11  1027   First MD Initiated Contact with Patient 10/02/11 1037      Chief Complaint  Patient presents with  . Sore Throat    (Consider location/radiation/quality/duration/timing/severity/associated sxs/prior treatment) HPI Comments: Patient presents urgent care complaining of a sore throat this started yesterday, very minor. But this morning she woke up with severe pain when she swallows and discomfort. Feels that maybe she has had a fever. Feeling no appetite at all and feeling somewhat tired. Patient denies any cough, congestion or shortness of breath. Is able to swallow fluids. She denies any abdominal pain nausea or vomiting.  Patient is a 26 y.o. female presenting with pharyngitis. The history is provided by the patient.  Sore Throat This is a recurrent problem. The current episode started less than 1 hour ago. The problem occurs constantly. Pertinent negatives include no headaches. Nothing aggravates the symptoms. Nothing relieves the symptoms. She has tried nothing for the symptoms. The treatment provided no relief.    History reviewed. No pertinent past medical history.  Past Surgical History  Procedure Date  . No past surgeries     No family history on file.  History  Substance Use Topics  . Smoking status: Current Every Day Smoker -- 0.2 packs/day for 10 years    Types: Cigarettes  . Smokeless tobacco: Not on file  . Alcohol Use: No     few days a week    OB History    Grav Para Term Preterm Abortions TAB SAB Ect Mult Living   2 2 1 1      1       Review of Systems  Constitutional: Positive for fever, activity change and appetite change. Negative for diaphoresis.  HENT: Positive for sore throat and trouble swallowing. Negative for ear pain, facial swelling, neck stiffness, dental problem and sinus pressure.   Eyes: Negative for pain.  Skin: Negative for rash and wound.  Neurological: Negative for  dizziness and headaches.    Allergies  Review of patient's allergies indicates no known allergies.  Home Medications   Current Outpatient Rx  Name Route Sig Dispense Refill  . ASPIRIN 81 MG PO TABS Oral Take 243 mg by mouth daily. For pain      . HYDROCODONE-ACETAMINOPHEN 7.5-500 MG/15ML PO SOLN Oral Take 15 mLs by mouth every 6 (six) hours as needed for pain. 120 mL 0  . PENICILLIN V POTASSIUM 500 MG PO TABS Oral Take 1 tablet (500 mg total) by mouth 4 (four) times daily. 40 tablet 0    BP 111/72  Pulse 90  Temp 98.4 F (36.9 C) (Oral)  Resp 18  SpO2 100%  LMP 08/22/2011  Physical Exam  Nursing note and vitals reviewed. Constitutional: Vital signs are normal. She appears well-developed.  Non-toxic appearance. She does not have a sickly appearance. She does not appear ill. She appears distressed.  HENT:  Head: Normocephalic.  Mouth/Throat: Uvula is midline and mucous membranes are normal. Oropharyngeal exudate and posterior oropharyngeal erythema present. No posterior oropharyngeal edema or tonsillar abscesses.  Eyes: Conjunctivae normal are normal.  Neck: Neck supple. No JVD present.  Pulmonary/Chest: Effort normal.  Lymphadenopathy:    She has cervical adenopathy.  Neurological: She is alert.  Skin: No rash noted. No erythema.    ED Course  Procedures (including critical care time)  Labs Reviewed  POCT RAPID STREP A ([facility] ONLY) - Abnormal; Notable for the following:  Streptococcus, Group A Screen (Direct) POSITIVE (*)     All other components within normal limits   No results found.   1. Strep pharyngitis     And  MDM  Uncomplicated streptococcal pharyngitis. No evidence of a parapharyngeal or tonsillar abscess. Patient has been started on penicillin to complete cycle for 10 days and was provided Lortab elixir to be taken with precautions every 6-8 hours as needed and to hydrate yourself well. Warning signs of difficulty swallowing or worsening  symptoms advised patient to go to the emergency department if worsening.        Jimmie Molly, MD 10/02/11 1133

## 2011-10-02 NOTE — ED Notes (Signed)
Woke this am with sore throat. 

## 2012-01-22 NOTE — L&D Delivery Note (Signed)
Delivery Note At 4:31 PM a viable female was delivered via Vaginal, Spontaneous Delivery (Presentation: ; Occiput Anterior).  APGAR: 9, 9; weight .   Placenta status: Intact, Spontaneous.  Cord: 3 vessels with the following complications: None.    Anesthesia: None  Episiotomy: None Lacerations: right and left labial lacerations-hemostatic with no repair needed. 1st degree perineal laceration also hemostatic with no repair needed  Est. Blood Loss (mL):  Mom to postpartum.  Baby to nursery-stable.  Alene Mires 03/25/2012, 4:50 PM

## 2012-01-22 NOTE — L&D Delivery Note (Signed)
I was present for this delivery and agree with the above midwife student's note.  LEFTWICH-KIRBY, LISA Certified Nurse-Midwife

## 2012-02-17 ENCOUNTER — Encounter (HOSPITAL_COMMUNITY): Payer: Self-pay

## 2012-02-17 ENCOUNTER — Encounter (HOSPITAL_COMMUNITY): Payer: Self-pay | Admitting: Advanced Practice Midwife

## 2012-02-17 ENCOUNTER — Inpatient Hospital Stay (HOSPITAL_COMMUNITY)
Admission: AD | Admit: 2012-02-17 | Discharge: 2012-02-17 | Disposition: A | Discharge status: Home / Self Care | Payer: Medicaid Other | Source: Ambulatory Visit | Attending: Obstetrics & Gynecology | Admitting: Obstetrics & Gynecology

## 2012-02-17 DIAGNOSIS — O09299 Supervision of pregnancy with other poor reproductive or obstetric history, unspecified trimester: Secondary | ICD-10-CM

## 2012-02-17 DIAGNOSIS — O99891 Other specified diseases and conditions complicating pregnancy: Secondary | ICD-10-CM | POA: Insufficient documentation

## 2012-02-17 DIAGNOSIS — O0930 Supervision of pregnancy with insufficient antenatal care, unspecified trimester: Secondary | ICD-10-CM | POA: Insufficient documentation

## 2012-02-17 NOTE — MAU Note (Signed)
Patient states her last period was in June. Has had no prenatal care. Wants to have an ultrasound to find out the sex of the baby. States she has a 25 year old with medical problems and she has not been able to see a doctor yet. Patient denies any pain, bleeding or leaking and feeling good fetal movement.

## 2012-02-17 NOTE — MAU Provider Note (Signed)
Attestation of Attending Supervision of Advanced Practitioner (CNM/NP): Evaluation and management procedures were performed by the Advanced Practitioner under my supervision and collaboration. I have reviewed the Advanced Practitioner's note and chart, and I agree with the management and plan.  Lamarco Gudiel H. 4:16 PM   

## 2012-02-17 NOTE — MAU Provider Note (Signed)
History     CSN: [id number]  Arrival date and time: 02/17/12 1009   First Provider Initiated Contact with Patient 02/17/12 1042      No chief complaint on file.  HPI This is a 27 y.o. female at about 32 weeks per LMP presents with request for ultrasound to see the gender of her baby. Has not had any prenatal care. Denies problems with pregnancy so far.  Does not think she is high risk, though she had a child 3 yrs ago with HLH (on chemo at [facility]) and then had a 2012 delivery of an IUFD at 23 weeks due to abruption with subsequent PPH.    RN Note: Patient states her last period was in June. Has had no prenatal care. Wants to have an ultrasound to find out the sex of the baby. States she has a 56 year old with medical problems and she has not been able to see a doctor yet. Patient denies any pain, bleeding or leaking and feeling good fetal movement.       OB History    Grav Para Term Preterm Abortions TAB SAB Ect Mult Living   3 2 1 1      1       No past medical history on file.  Past Surgical History  Procedure Date  . No past surgeries     No family history on file.  History  Substance Use Topics  . Smoking status: Current Every Day Smoker -- 0.2 packs/day for 10 years    Types: Cigarettes  . Smokeless tobacco: Not on file  . Alcohol Use: No     Comment: few days a week    Allergies: No Known Allergies  Prescriptions prior to admission  Medication Sig Dispense Refill  . [DISCONTINUED] aspirin 81 MG tablet Take 243 mg by mouth daily. For pain          Review of Systems  Constitutional: Negative for fever, chills and malaise/fatigue.  Gastrointestinal: Negative for nausea, vomiting, abdominal pain, diarrhea and constipation.  Genitourinary: Negative for dysuria.  Neurological: Negative for dizziness, weakness and headaches.   Physical Exam   Blood pressure 128/73, pulse 84, temperature 98.8 F (37.1 C), temperature source Oral, resp. rate 16, height 5'  6.5" (1.689 m), weight 66.497 kg (146 lb 9.6 oz), SpO2 100.00%.  Physical Exam  Constitutional: She is oriented to person, place, and time. She appears well-developed and well-nourished. No distress.  HENT:  Head: Normocephalic.  Cardiovascular: Normal rate.   Respiratory: Effort normal.  GI: Soft. She exhibits no distension and no mass. There is no tenderness. There is no rebound and no guarding.       Fundal height c/w 32-33 weeks  Musculoskeletal: Normal range of motion.  Neurological: She is alert and oriented to person, place, and time.  Skin: Skin is warm and dry.  Psychiatric: She has a normal mood and affect.    MAU Course  Procedures  MDM DIscussed high risk nature of her pregnancy due to genetic issues (states it came on after birth and was "on the daddy's side" so it is not "high risk" and prior IUFD.  Assessment and Plan  A:  SIUP at 32-33 weeks by LMP      No Prenatal care       History of child with Genetic disorder       History of IUFD due to abruption with subsequent PPH  P:  Discharged      Ordered  consult to MFM for [place] and genetic counseling.      Will refer into HR clinic  [facility] 02/17/2012, 10:52 AM

## 2012-02-18 ENCOUNTER — Ambulatory Visit (HOSPITAL_COMMUNITY): Payer: Medicaid Other

## 2012-02-20 ENCOUNTER — Encounter: Payer: Medicaid Other | Admitting: Family

## 2012-02-25 ENCOUNTER — Ambulatory Visit (HOSPITAL_COMMUNITY): Payer: Medicaid Other

## 2012-03-03 ENCOUNTER — Ambulatory Visit (HOSPITAL_COMMUNITY)
Admission: RE | Admit: 2012-03-03 | Discharge: 2012-03-03 | Disposition: A | Discharge status: Home / Self Care | Payer: Medicaid Other | Source: Ambulatory Visit | Attending: Advanced Practice Midwife | Admitting: Advanced Practice Midwife

## 2012-03-03 ENCOUNTER — Ambulatory Visit (INDEPENDENT_AMBULATORY_CARE_PROVIDER_SITE_OTHER): Payer: Medicaid Other | Admitting: Obstetrics & Gynecology

## 2012-03-03 ENCOUNTER — Encounter: Payer: Self-pay | Admitting: Obstetrics & Gynecology

## 2012-03-03 ENCOUNTER — Encounter (HOSPITAL_COMMUNITY): Payer: Self-pay

## 2012-03-03 VITALS — BP 138/88 | Temp 96.5°F | Wt 149.6 lb

## 2012-03-03 VITALS — BP 131/80

## 2012-03-03 VITALS — BP 125/92 | HR 89 | Wt 149.0 lb

## 2012-03-03 DIAGNOSIS — O99333 Smoking (tobacco) complicating pregnancy, third trimester: Secondary | ICD-10-CM

## 2012-03-03 DIAGNOSIS — O99330 Smoking (tobacco) complicating pregnancy, unspecified trimester: Secondary | ICD-10-CM | POA: Insufficient documentation

## 2012-03-03 DIAGNOSIS — Z1389 Encounter for screening for other disorder: Secondary | ICD-10-CM | POA: Insufficient documentation

## 2012-03-03 DIAGNOSIS — O358XX0 Maternal care for other (suspected) fetal abnormality and damage, not applicable or unspecified: Secondary | ICD-10-CM | POA: Insufficient documentation

## 2012-03-03 DIAGNOSIS — C8589 Other specified types of non-Hodgkin lymphoma, extranodal and solid organ sites: Secondary | ICD-10-CM

## 2012-03-03 DIAGNOSIS — O0930 Supervision of pregnancy with insufficient antenatal care, unspecified trimester: Secondary | ICD-10-CM

## 2012-03-03 DIAGNOSIS — Z363 Encounter for antenatal screening for malformations: Secondary | ICD-10-CM | POA: Insufficient documentation

## 2012-03-03 DIAGNOSIS — O0933 Supervision of pregnancy with insufficient antenatal care, third trimester: Secondary | ICD-10-CM

## 2012-03-03 DIAGNOSIS — O09299 Supervision of pregnancy with other poor reproductive or obstetric history, unspecified trimester: Secondary | ICD-10-CM

## 2012-03-03 DIAGNOSIS — O09293 Supervision of pregnancy with other poor reproductive or obstetric history, third trimester: Secondary | ICD-10-CM

## 2012-03-03 DIAGNOSIS — C8590 Non-Hodgkin lymphoma, unspecified, unspecified site: Secondary | ICD-10-CM

## 2012-03-03 LAB — COMPLETE METABOLIC PANEL WITH GFR
Alkaline Phosphatase: 135 U/L — ABNORMAL HIGH (ref 39–117)
BUN: 4 mg/dL — ABNORMAL LOW (ref 6–23)
Creat: 0.63 mg/dL (ref 0.50–1.10)
GFR, Est African American: >89 mL/min
GFR, Est Non African American: >89 mL/min
Glucose, Bld: 73 mg/dL (ref 70–99)
Sodium: 137 mEq/L (ref 135–145)
Total Bilirubin: 0.4 mg/dL (ref 0.3–1.2)
Total Protein: 6 g/dL (ref 6.0–8.3)

## 2012-03-03 NOTE — Progress Notes (Signed)
Pt came down from MFM for a new OB visit.  Pt said that she did not have time to stay for an exam.  Did not see a physician this visit. Still needs physician history and exam.  Had NST done and labs drawn.

## 2012-03-03 NOTE — Progress Notes (Signed)
P = 68  Pt states she cannot stay for complete New Ob appt today. Pt was seen @ MFM today for [place] and consult and was recommended to have New Ob appt. She consented to having NST and labs only. Pt Hx reviewed w/Dr. Erin Fulling.

## 2012-03-03 NOTE — Progress Notes (Signed)
Regina Newman  was seen today for an ultrasound appointment.  See full report in [facility].  Impression: Single IUP at 33 6/7 weeks Late prenatal care, hx of 23 week IUFD due to abruption Normal anatomic fetal survey Estimated fetal weight at the 58th %tile Normal amniotic fluid volume  Recommendations: Recommend follow-up ultrasound examination in 3 weeks for interval growth 2x weekly NSTs with weekly AFI  See separate consult  Alpha Gula, MD

## 2012-03-03 NOTE — Progress Notes (Signed)
Maternal Fetal Medicine Consultation Requesting Provider(s): [facility] clinic, [place], [place] Reason for consultation: History of IUFD at 23 weeks, placental abruption   HPI: 27 yo G3P1101 at 34 weeks by 3rd trimester U/s performed today in clinic. She is referred secondary to IUFD at 23 weeks, with placental abruption. The pathology evaluation revealed evidence of placental infarcts. Also, she is poorly compliant with [facility] and is in a difficult socio-economic situation, and a smoker. OB History:  G1 FT SVD baby with HLH ??, and is followed at [facility]. G2 23 weeks IUFD with placental abruption G3 present PMH: History reviewed. No pertinent past medical history PSH: History reviewed. No pertinent past surgical history Meds: PNV Allergies: Denies any food or drug allergies FH: History reviewed. No pertinent family history of diabetes, hypertension, cancers, heart disease, genetic abnormalities, and birth defects. Stated that her pregnancy is with a different father than the living child.  Soc: Smokes  PPD since 59 yo, and drank alcohol (1 beer) every other weekend until around thanksgiving holiday. Denies any personal use or exposure to alcohol, and illegal drugs.  Review of Systems: Good fetal activity, no vaginal bleeding or cramping, contractions, no nausea/vomiting. All other systems reviewed and are negative  Labs: Available data personally reviewed PE: BP 125/92, hr 80. Repeat 131/80, hr 60 GEN: well-appearing female, resting in chair in no acute distress Abdomen: gravid Ultrasound: Please see separate document for fetal ultrasound report.  A/P: 27 yo P1101 at 34+ weeks, with IUFD at 31 weeks-secondary to placental abruption, poor compliant with prenatal care, smoker, with slightly elevated/normal range BP in clinic today and diastolic .  I discussed that she is at increased risk of adverse pregnancy outcome, including, but not limited to abruption, fetal growth restriction, and  perinatal death. The risk increases with severity of hypertension, smoking, and presence of end-organ/placental damage. I also discussed that risk of preterm delivery is increased.   Various possible causes of fetal demises were reviewed, including but not limited to infections, chromosomal and genetic abnormalities, smoking, maternal medical disorders, cord event, placental abruption, pre eclampsia, and others. Had she presented earlier in gestation, we would have reccommended antiphospholipid antibodies work up.  Smoking cessation was advised and discussed with patient.  She is trying to "cut down".  Effects of smoking on fetus and pregnancy were discussed with patient including low birth weight, growth restriction, placental abruption, poor maternal wound healing, increased post partum pulmonary complications, increased neonatal and maternal morbidity, increased SIDS, and association with childhood leukemia. Patient states that she will think about her options and use assistance if she is unable to stop.   We recommend: Sign a release of records for [facility] to obtain records about her son with HLH?  - Routine prenatal labs, baseline studies: Comprehensive metabolic panel, 24 hr urine and monthly urine cultures as she has sickle cell trait. - Get up to day on all vaccinations -Transdermal or oral nicotine should be considered if the patient desires cessation. - Continue PNV, Folic acid, Fe, Vitamin D and calcium - Fetal movement, kick count log - Genetics consultation and testing for patient and father of this baby - Serial ultrasounds for fetal growth every 3 weeks - Antenatal testing, either 2x weekly NST with weekly AFI, or 2x weekly BPP to begin this week. -We do not recommend delivery prior to 39 weeks. However, in the presence of any obstetric complications, delivery at an earlier time may be applicable.   Case reviewed with Dr. Claudean Severance  Thank you  for the opportunity to be a part of the  care of Mrs. Regina Newman.  We will continue to follow her in our [facility] if desired. Please contact our office if we can be of further assistance.  I spent approximately 30 minutes with this patient with over 50% of time spent in face-to-face counseling.

## 2012-03-04 ENCOUNTER — Telehealth: Payer: Self-pay | Admitting: *Deleted

## 2012-03-04 LAB — OBSTETRIC PANEL
Antibody Screen: NEGATIVE
Basophils Relative: 0 % (ref 0–1)
Eosinophils Absolute: 0.1 10*3/uL (ref 0.0–0.7)
HCT: 27.2 % — ABNORMAL LOW (ref 36.0–46.0)
Hemoglobin: 9.1 g/dL — ABNORMAL LOW (ref 12.0–15.0)
Lymphs Abs: 1.7 10*3/uL (ref 0.7–4.0)
MCH: 25.6 pg — ABNORMAL LOW (ref 26.0–34.0)
MCHC: 33.5 g/dL (ref 30.0–36.0)
Monocytes Absolute: 0.7 10*3/uL (ref 0.1–1.0)
Monocytes Relative: 8 % (ref 3–12)
Neutro Abs: 6.1 10*3/uL (ref 1.7–7.7)
Rh Type: POSITIVE

## 2012-03-04 NOTE — Telephone Encounter (Signed)
Message copied by Mannie Stabile on Wed Mar 04, 2012  3:43 PM ------      Message from: Willodean Rosenthal      Created: Wed Mar 04, 2012  2:12 PM       Please call pt and notify her of anemia.  Please take FeSO4 bid and also prenatal vitamins.            Also, stress to her the importance of keeping her INITIAL prenatal visit as she has NOT seen a physician yet in this pregnancy.            clh-S             ------

## 2012-03-05 ENCOUNTER — Other Ambulatory Visit: Payer: Medicaid Other

## 2012-03-09 ENCOUNTER — Other Ambulatory Visit: Payer: Medicaid Other

## 2012-03-10 NOTE — Telephone Encounter (Signed)
Called pt and left message to please return call to the clinics.  

## 2012-03-11 NOTE — Telephone Encounter (Signed)
Spoke to patient and notified of anemia and to take FeSO4 plus prenatal vitamins. Patient also notified of upcoming prenatal appointments. Patient agrees and satisfied.

## 2012-03-12 ENCOUNTER — Other Ambulatory Visit: Payer: Medicaid Other

## 2012-03-18 ENCOUNTER — Encounter: Payer: Self-pay | Admitting: *Deleted

## 2012-03-20 ENCOUNTER — Encounter: Payer: Self-pay | Admitting: *Deleted

## 2012-03-20 ENCOUNTER — Other Ambulatory Visit: Payer: Medicaid Other

## 2012-03-20 ENCOUNTER — Encounter: Payer: Self-pay | Admitting: Obstetrics & Gynecology

## 2012-03-20 DIAGNOSIS — Z91199 Patient's noncompliance with other medical treatment and regimen due to unspecified reason: Secondary | ICD-10-CM | POA: Insufficient documentation

## 2012-03-20 DIAGNOSIS — Z9119 Patient's noncompliance with other medical treatment and regimen: Secondary | ICD-10-CM

## 2012-03-24 ENCOUNTER — Other Ambulatory Visit (HOSPITAL_COMMUNITY): Payer: Self-pay | Admitting: Maternal and Fetal Medicine

## 2012-03-24 ENCOUNTER — Ambulatory Visit (HOSPITAL_COMMUNITY)
Admission: RE | Admit: 2012-03-24 | Discharge: 2012-03-24 | Disposition: A | Discharge status: Home / Self Care | Payer: Medicaid Other | Source: Ambulatory Visit | Attending: Advanced Practice Midwife | Admitting: Advanced Practice Midwife

## 2012-03-24 DIAGNOSIS — O99330 Smoking (tobacco) complicating pregnancy, unspecified trimester: Secondary | ICD-10-CM | POA: Insufficient documentation

## 2012-03-24 DIAGNOSIS — O09299 Supervision of pregnancy with other poor reproductive or obstetric history, unspecified trimester: Secondary | ICD-10-CM | POA: Insufficient documentation

## 2012-03-24 DIAGNOSIS — O0930 Supervision of pregnancy with insufficient antenatal care, unspecified trimester: Secondary | ICD-10-CM | POA: Insufficient documentation

## 2012-03-25 ENCOUNTER — Inpatient Hospital Stay (HOSPITAL_COMMUNITY)
Admission: AD | Admit: 2012-03-25 | Discharge: 2012-03-27 | DRG: 775 | Disposition: A | Discharge status: Home / Self Care | Payer: Medicaid Other | Source: Ambulatory Visit | Attending: Obstetrics & Gynecology | Admitting: Obstetrics & Gynecology

## 2012-03-25 ENCOUNTER — Encounter (HOSPITAL_COMMUNITY): Payer: Self-pay | Admitting: *Deleted

## 2012-03-25 DIAGNOSIS — O99344 Other mental disorders complicating childbirth: Secondary | ICD-10-CM | POA: Diagnosis present

## 2012-03-25 DIAGNOSIS — O0930 Supervision of pregnancy with insufficient antenatal care, unspecified trimester: Secondary | ICD-10-CM

## 2012-03-25 DIAGNOSIS — F1210 Cannabis abuse, uncomplicated: Secondary | ICD-10-CM

## 2012-03-25 LAB — COMPREHENSIVE METABOLIC PANEL
ALT: 7 U/L (ref 0–35)
Alkaline Phosphatase: 166 U/L — ABNORMAL HIGH (ref 39–117)
CO2: 19 mEq/L (ref 19–32)
Chloride: 102 mEq/L (ref 96–112)
GFR calc Af Amer: >90 mL/min (ref >90)
GFR calc non Af Amer: >90 mL/min (ref >90)
Glucose, Bld: 81 mg/dL (ref 70–99)
Potassium: 3.2 mEq/L — ABNORMAL LOW (ref 3.5–5.1)
Sodium: 135 mEq/L (ref 135–145)
Total Bilirubin: 0.4 mg/dL (ref 0.3–1.2)

## 2012-03-25 LAB — OB RESULTS CONSOLE GBS
GBS: POSITIVE
GBS: POSITIVE

## 2012-03-25 LAB — GROUP B STREP BY PCR: Group B strep by PCR: POSITIVE — AB

## 2012-03-25 LAB — CBC
HCT: 29.5 % — ABNORMAL LOW (ref 36.0–46.0)
Hemoglobin: 9.5 g/dL — ABNORMAL LOW (ref 12.0–15.0)
MCHC: 32.2 g/dL (ref 30.0–36.0)
WBC: 8.1 10*3/uL (ref 4.0–10.5)

## 2012-03-25 MED ORDER — ONDANSETRON HCL 4 MG/2ML IJ SOLN: 4.0000 mg | INTRAMUSCULAR | Status: DC | PRN

## 2012-03-25 MED ORDER — TETANUS-DIPHTH-ACELL PERTUSSIS 5-2.5-18.5 LF-MCG/0.5 IM SUSP
0.5000 mL | Freq: Once | INTRAMUSCULAR | Status: AC
  Administered 2012-03-26: 0.5 mL via INTRAMUSCULAR
  Filled 2012-03-25: qty 0.5

## 2012-03-25 MED ORDER — CITRIC ACID-SODIUM CITRATE 334-500 MG/5ML PO SOLN: 30.0000 mL | ORAL | Status: DC | PRN

## 2012-03-25 MED ORDER — IBUPROFEN 600 MG PO TABS
600.0000 mg | ORAL_TABLET | Freq: Four times a day (QID) | ORAL | Status: DC | PRN
  Administered 2012-03-25: 600 mg via ORAL
  Filled 2012-03-25 (×2): qty 1

## 2012-03-25 MED ORDER — ONDANSETRON HCL 4 MG/2ML IJ SOLN
4.0000 mg | INTRAMUSCULAR | Status: AC
  Administered 2012-03-25: 4 mg via INTRAVENOUS
  Filled 2012-03-25: qty 2

## 2012-03-25 MED ORDER — LANOLIN HYDROUS EX OINT: TOPICAL_OINTMENT | CUTANEOUS | Status: DC | PRN

## 2012-03-25 MED ORDER — ONDANSETRON HCL 4 MG/2ML IJ SOLN: 4.0000 mg | Freq: Four times a day (QID) | INTRAMUSCULAR | Status: DC | PRN

## 2012-03-25 MED ORDER — DIPHENHYDRAMINE HCL 25 MG PO CAPS: 25.0000 mg | ORAL_CAPSULE | Freq: Four times a day (QID) | ORAL | Status: DC | PRN

## 2012-03-25 MED ORDER — LACTATED RINGERS IV SOLN: 500.0000 mL | INTRAVENOUS | Status: DC | PRN

## 2012-03-25 MED ORDER — SENNOSIDES-DOCUSATE SODIUM 8.6-50 MG PO TABS
2.0000 | ORAL_TABLET | Freq: Every day | ORAL | Status: DC
  Administered 2012-03-25 – 2012-03-26 (×2): 2 via ORAL

## 2012-03-25 MED ORDER — WITCH HAZEL-GLYCERIN EX PADS: 1.0000 "application " | MEDICATED_PAD | CUTANEOUS | Status: DC | PRN

## 2012-03-25 MED ORDER — OXYCODONE-ACETAMINOPHEN 5-325 MG PO TABS: 1.0000 | ORAL_TABLET | ORAL | Status: DC | PRN

## 2012-03-25 MED ORDER — ONDANSETRON HCL 4 MG PO TABS: 4.0000 mg | ORAL_TABLET | ORAL | Status: DC | PRN

## 2012-03-25 MED ORDER — FENTANYL CITRATE 0.05 MG/ML IJ SOLN
100.0000 ug | INTRAMUSCULAR | Status: AC
  Administered 2012-03-25: 100 ug via INTRAVENOUS
  Filled 2012-03-25: qty 2

## 2012-03-25 MED ORDER — LIDOCAINE HCL (PF) 1 % IJ SOLN
30.0000 mL | INTRAMUSCULAR | Status: DC | PRN
  Filled 2012-03-25: qty 30

## 2012-03-25 MED ORDER — DIBUCAINE 1 % RE OINT: 1.0000 "application " | TOPICAL_OINTMENT | RECTAL | Status: DC | PRN

## 2012-03-25 MED ORDER — SIMETHICONE 80 MG PO CHEW: 80.0000 mg | CHEWABLE_TABLET | ORAL | Status: DC | PRN

## 2012-03-25 MED ORDER — ZOLPIDEM TARTRATE 5 MG PO TABS: 5.0000 mg | ORAL_TABLET | Freq: Every evening | ORAL | Status: DC | PRN

## 2012-03-25 MED ORDER — LACTATED RINGERS IV SOLN
INTRAVENOUS | Status: DC
  Administered 2012-03-25: 16:00:00 via INTRAVENOUS

## 2012-03-25 MED ORDER — OXYCODONE-ACETAMINOPHEN 5-325 MG PO TABS
1.0000 | ORAL_TABLET | ORAL | Status: DC | PRN
  Administered 2012-03-26: 1 via ORAL
  Administered 2012-03-26: 2 via ORAL
  Administered 2012-03-26: 1 via ORAL
  Administered 2012-03-26: 2 via ORAL
  Administered 2012-03-26: 1 via ORAL
  Administered 2012-03-27 (×3): 2 via ORAL
  Filled 2012-03-25 (×2): qty 1
  Filled 2012-03-25 (×2): qty 2
  Filled 2012-03-25: qty 1
  Filled 2012-03-25 (×3): qty 2

## 2012-03-25 MED ORDER — BENZOCAINE-MENTHOL 20-0.5 % EX AERO: 1.0000 "application " | INHALATION_SPRAY | CUTANEOUS | Status: DC | PRN

## 2012-03-25 MED ORDER — LACTATED RINGERS IV BOLUS (SEPSIS): 1000.0000 mL | Freq: Once | INTRAVENOUS | Status: DC

## 2012-03-25 MED ORDER — OXYTOCIN 40 UNITS IN LACTATED RINGERS INFUSION - SIMPLE MED
62.5000 mL/h | INTRAVENOUS | Status: DC
  Administered 2012-03-25: 62.5 mL/h via INTRAVENOUS
  Filled 2012-03-25: qty 1000

## 2012-03-25 MED ORDER — ACETAMINOPHEN 325 MG PO TABS: 650.0000 mg | ORAL_TABLET | ORAL | Status: DC | PRN

## 2012-03-25 MED ORDER — PRENATAL MULTIVITAMIN CH
1.0000 | ORAL_TABLET | Freq: Every day | ORAL | Status: DC
  Administered 2012-03-26 – 2012-03-27 (×2): 1 via ORAL
  Filled 2012-03-25 (×2): qty 1

## 2012-03-25 MED ORDER — OXYTOCIN BOLUS FROM INFUSION: 500.0000 mL | INTRAVENOUS | Status: DC

## 2012-03-25 MED ORDER — IBUPROFEN 600 MG PO TABS
600.0000 mg | ORAL_TABLET | Freq: Four times a day (QID) | ORAL | Status: DC
  Administered 2012-03-26 – 2012-03-27 (×7): 600 mg via ORAL
  Filled 2012-03-25 (×7): qty 1

## 2012-03-25 NOTE — Progress Notes (Signed)
Call placed to on call resident to notify that patient refused to be in and out cath.  Order placed by resident to in and out cath and rapid drug screen.  Will try to collect clean catch urine per resident's verbal orders.  Alexis [employer]
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
## 2012-03-26 LAB — PROTEIN / CREATININE RATIO, URINE: Protein Creatinine Ratio: 0.79 — ABNORMAL HIGH (ref 0.00–0.15)

## 2012-03-26 LAB — RAPID URINE DRUG SCREEN, HOSP PERFORMED
Barbiturates: NOT DETECTED
Cocaine: NOT DETECTED
Opiates: NOT DETECTED

## 2012-03-26 MED ORDER — PNEUMOCOCCAL VAC POLYVALENT 25 MCG/0.5ML IJ INJ
0.5000 mL | INJECTION | Freq: Once | INTRAMUSCULAR | Status: DC
  Filled 2012-03-26: qty 0.5

## 2012-03-26 MED ORDER — INFLUENZA VIRUS VACC SPLIT PF IM SUSP
0.5000 mL | Freq: Once | INTRAMUSCULAR | Status: AC
  Administered 2012-03-26: 0.5 mL via INTRAMUSCULAR

## 2012-03-26 NOTE — Progress Notes (Signed)
Post Partum Day 1 Subjective: no complaints, up ad lib, voiding, tolerating PO and + flatus  Objective: Blood pressure 130/79, pulse 63, temperature 98 F (36.7 C), temperature source Oral, resp. rate 20, height 5\' 7"  (1.702 m), weight 67.132 kg (148 lb), SpO2 100.00%, unknown if currently breastfeeding.  Physical Exam:  General: alert, cooperative and appears stated age Lochia: appropriate Uterine Fundus: firm DVT Evaluation: No evidence of DVT seen on physical exam. No significant calf/ankle edema.   Recent Labs  03/25/12 1610  HGB 9.5*  HCT 29.5*    Assessment/Plan: # NSVD -- Plan for discharge tomorrow 2/2 GBS + status w/o treatment 2/2 absent records.  -- Social Work consult,    LOS: 1 day   Gregor Hams 03/26/2012, 7:23 AM

## 2012-03-26 NOTE — H&P (Signed)

## 2012-03-26 NOTE — Clinical Social Work Maternal (Signed)
Clinical Social Work Department PSYCHOSOCIAL ASSESSMENT - MATERNAL/CHILD 03/26/2012  Patient:  Regina Newman, Regina Newman  Account Number:  [id number]  Admit Date:  03/25/2012  Marjo Bicker Name:   Franchot Erichsen    Clinical Social Worker:  Nobie Putnam, LCSW   Date/Time:  03/26/2012 11:13 AM  Date Referred:  03/26/2012   Referral source  CN     Referred reason  Other - See comment  Substance Abuse   Other referral source:   [facility]    I:  FAMILY / HOME ENVIRONMENT Child's legal guardian:  PARENT  Guardian - Name Guardian - Age Guardian - Address  Regina Newman [address] [place]; [place], [place] [address]  Brynda Peon 36    Other household support members/support persons Name Relationship DOB  Velora Mediate SISTER    NIECE    NEPHEW   Tiondra Fang SON 31 years old   Other support:    II  PSYCHOSOCIAL DATA Information Source:  Patient Interview  Event organiser Employment:   Surveyor, quantity resources:  [employer] If Medicaid - County:  [employer] Other  Chemical engineer / Grade:   Maternity Care Coordinator / Child Services Coordination / Early Interventions:  Cultural issues impacting care:    III  STRENGTHS Strengths  Home prepared for Child (including basic supplies)  Supportive family/friends   Strength comment:    IV  RISK FACTORS AND CURRENT PROBLEMS Current Problem:  YES   Risk Factor & Current Problem Patient Issue Family Issue Risk Factor / Current Problem Comment  Other - See comment Y N NPNC  Substance Abuse Y N Hx of MJ    V  SOCIAL WORK ASSESSMENT CSW referral received to assess the reason for limited [facility] & history of MJ use.  Pt is currently living with her sister & her 40 year old son.  Pt told CSW that she was unable to attend [facility] appointments regularly due to lack of transportation.  Pt had to depend on rides & when they didn't show, she did not have the resources to secure other transportation.  She admits to smoking MJ during  the pregnancy, at least "3 x's."  The last time she smoked was on Sunday, March 2.  She denies other illegal substance use & verbalized understanding of hospital drug testing policy. UDS & meconium results are pending.  Pt experienced a loss in 7/12 @ 23 weeks.  She experienced some depression but never sought professional counseling.  Pt did speak with her sister when needed but states she didn't like to talk about the loss. She denies any current depression or SI history.  Pt has majority of supplies for the infant but expressed a need for additional pampers & a crib.  CSW suggestions local consignment shops to purchase a crib. CSW provided pt with a bundle pack of clothing.  Pt appears to be appropriate but withdrawn.  She is caring for her son who has a rare diease,( HLH), independently.  Her support system is limited to her sister.  She thinks the FOB will be somewhat involved.  CSW will continue to monitor drug screen results and make a referral if needed.      VI SOCIAL WORK PLAN Social Work Plan  No Further Intervention Required / No Barriers to Discharge   Type of pt/family education:   If child protective services report - county:   If child protective services report - date:   Information/referral to community resources comment:  Other social work plan:

## 2012-03-26 NOTE — Progress Notes (Signed)
S: Pt examined by Dr. Emelda Fear. She denies headache, scotoma, and RUQ pain.   O:  Filed Vitals:   03/26/12 0030  BP: 131/69  Pulse: 59  Temp: 97.7 F (36.5 C)  Resp: 18   Gen: NAD Neuro: 1+ DTRs, No clonus  A/P  27 y/o [id number] PPD#1 after SVD With suspicion fo rpre-eclampsia - Urine Pro/Cre ratio 0.79, BPs now normalized, and no neurologic signs of pre-eclampsia.  - Will defer treatment with magnesium for now with clos surveilence  Kevin Fenton, MD 03/26/2012, 323 am

## 2012-03-26 NOTE — Progress Notes (Signed)
UR completed 

## 2012-03-27 MED ORDER — PRENATAL MULTIVITAMIN CH: 1.0000 | ORAL_TABLET | Freq: Every day | ORAL | Status: DC

## 2012-03-27 MED ORDER — IBUPROFEN 600 MG PO TABS: 600.0000 mg | ORAL_TABLET | Freq: Four times a day (QID) | ORAL | Status: DC

## 2012-03-27 NOTE — Discharge Summary (Signed)
Attestation of Attending Supervision of Advanced Practitioner (CNM/NP): Evaluation and management procedures were performed by the Advanced Practitioner under my supervision and collaboration.  I have reviewed the Advanced Practitioner's note and chart, and I agree with the management and plan.  HARRAWAY-SMITH, CAROLYN 9:22 AM     

## 2012-03-27 NOTE — Progress Notes (Signed)
Attestation of Attending Supervision of Resident: Evaluation and management procedures were performed by the [facility] Resident under my supervision.  I have seen and examined the patient, reviewed the resident's note and chart, and I agree with the management and plan.  Anibal Henderson, M.D. 03/27/2012 9:18 AM

## 2012-03-27 NOTE — Progress Notes (Signed)
Attestation of Attending Supervision of Resident: Evaluation and management procedures were performed by the [facility] Medicine Resident under my supervision.  I have seen and examined the patient, reviewed the resident's note and chart, and I agree with the management and plan.  Anibal Henderson, M.D. 03/27/2012 9:19 AM

## 2012-03-27 NOTE — Discharge Summary (Signed)
Obstetric Discharge Summary Reason for Admission: onset of labor Prenatal Procedures: NST Intrapartum Procedures: spontaneous vaginal delivery Postpartum Procedures: none Complications-Operative and Postpartum: 1st degree perineal laceration, no repair needed Hemoglobin  Date Value Range Status  03/25/2012 9.5* 12.0 - 15.0 g/dL Final     HCT  Date Value Range Status  03/25/2012 29.5* 36.0 - 46.0 % Final    Physical Exam:  General: alert, cooperative and appears stated age Lochia: appropriate Uterine Fundus: firm DVT Evaluation: No evidence of DVT seen on physical exam. No significant calf/ankle edema.  Discharge Diagnoses: Term Pregnancy-delivered  Discharge Information: Date: 03/27/2012 Activity: pelvic rest Diet: routine Medications: Ibuprofen Condition: stable Instructions: refer to practice specific booklet Birth control: pt request depo, and considering IUD insertion Discharge to: home Follow-up Information   Follow [facility] with  [facility] (03/30/12 for depo injection and IUD consult.  Follow up for 6 week post partum visin)    Contact information:   [address] [place] [address] [phone]      Newborn Data: Live born female  Birth Weight: 6 lb 0.8 oz (2745 g) APGAR: 9, 9  Home with mother.  Gregor Hams 03/27/2012, 9:20 AM

## 2012-03-27 NOTE — Progress Notes (Signed)
CSW waited 2 + hours on CPS line to report positive UDS (marijuana) results but no one answered.  After-hours emergency staff not able to assist me since the agency is open, as per receptionist.  CSW is unsure if agency has power because all lines are forwarding directly to voicemail.  CSW will make report on Monday.

## 2013-05-04 ENCOUNTER — Emergency Department (HOSPITAL_COMMUNITY)
Admission: EM | Admit: 2013-05-04 | Discharge: 2013-05-04 | Disposition: A | Discharge status: Home / Self Care | Payer: Medicaid Other | Attending: Emergency Medicine | Admitting: Emergency Medicine

## 2013-05-04 ENCOUNTER — Encounter (HOSPITAL_COMMUNITY): Payer: Self-pay | Admitting: Emergency Medicine

## 2013-05-04 DIAGNOSIS — IMO0002 Reserved for concepts with insufficient information to code with codable children: Secondary | ICD-10-CM | POA: Insufficient documentation

## 2013-05-04 DIAGNOSIS — Y92410 Unspecified street and highway as the place of occurrence of the external cause: Secondary | ICD-10-CM | POA: Insufficient documentation

## 2013-05-04 DIAGNOSIS — Z79899 Other long term (current) drug therapy: Secondary | ICD-10-CM | POA: Insufficient documentation

## 2013-05-04 DIAGNOSIS — Z8619 Personal history of other infectious and parasitic diseases: Secondary | ICD-10-CM | POA: Insufficient documentation

## 2013-05-04 DIAGNOSIS — Z23 Encounter for immunization: Secondary | ICD-10-CM | POA: Insufficient documentation

## 2013-05-04 DIAGNOSIS — F17200 Nicotine dependence, unspecified, uncomplicated: Secondary | ICD-10-CM | POA: Insufficient documentation

## 2013-05-04 DIAGNOSIS — Z791 Long term (current) use of non-steroidal anti-inflammatories (NSAID): Secondary | ICD-10-CM | POA: Insufficient documentation

## 2013-05-04 DIAGNOSIS — D649 Anemia, unspecified: Secondary | ICD-10-CM | POA: Insufficient documentation

## 2013-05-04 DIAGNOSIS — T148XXA Other injury of unspecified body region, initial encounter: Secondary | ICD-10-CM

## 2013-05-04 DIAGNOSIS — Y9389 Activity, other specified: Secondary | ICD-10-CM | POA: Insufficient documentation

## 2013-05-04 MED ORDER — TETANUS-DIPHTH-ACELL PERTUSSIS 5-2.5-18.5 LF-MCG/0.5 IM SUSP
0.5000 mL | Freq: Once | INTRAMUSCULAR | Status: AC
  Administered 2013-05-04: 0.5 mL via INTRAMUSCULAR
  Filled 2013-05-04: qty 0.5
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
## 2013-05-04 NOTE — ED Provider Notes (Signed)
Medical screening examination/treatment/procedure(s) were performed by non-physician practitioner and as supervising physician I was immediately available for consultation/collaboration.   EKG Interpretation None        Julianne Rice, MD 05/04/13 [phone]
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
## 2013-05-19 NOTE — ED Provider Notes (Signed)
Medical screening examination/treatment/procedure(s) were performed by non-physician practitioner and as supervising physician I was immediately available for consultation/collaboration.   EKG Interpretation None        Julianne Rice, MD 05/19/13 [phone]

## 2013-09-20 ENCOUNTER — Encounter (HOSPITAL_COMMUNITY): Payer: Self-pay | Admitting: Emergency Medicine

## 2013-09-20 ENCOUNTER — Emergency Department (HOSPITAL_COMMUNITY)
Admission: EM | Admit: 2013-09-20 | Discharge: 2013-09-20 | Disposition: A | Discharge status: Home / Self Care | Payer: Medicaid Other | Attending: Emergency Medicine | Admitting: Emergency Medicine

## 2013-09-20 DIAGNOSIS — Z862 Personal history of diseases of the blood and blood-forming organs and certain disorders involving the immune mechanism: Secondary | ICD-10-CM | POA: Insufficient documentation

## 2013-09-20 DIAGNOSIS — Z8619 Personal history of other infectious and parasitic diseases: Secondary | ICD-10-CM | POA: Insufficient documentation

## 2013-09-20 DIAGNOSIS — K089 Disorder of teeth and supporting structures, unspecified: Secondary | ICD-10-CM | POA: Insufficient documentation

## 2013-09-20 DIAGNOSIS — K0889 Other specified disorders of teeth and supporting structures: Secondary | ICD-10-CM

## 2013-09-20 DIAGNOSIS — K0510 Chronic gingivitis, plaque induced: Secondary | ICD-10-CM | POA: Diagnosis not present

## 2013-09-20 DIAGNOSIS — R220 Localized swelling, mass and lump, head: Secondary | ICD-10-CM

## 2013-09-20 DIAGNOSIS — F17200 Nicotine dependence, unspecified, uncomplicated: Secondary | ICD-10-CM | POA: Insufficient documentation

## 2013-09-20 MED ORDER — NAPROXEN 500 MG PO TABS: 500.0000 mg | ORAL_TABLET | Freq: Two times a day (BID) | ORAL | Status: DC

## 2013-09-20 MED ORDER — HYDROCODONE-ACETAMINOPHEN 5-325 MG PO TABS
2.0000 | ORAL_TABLET | Freq: Once | ORAL | Status: AC
  Administered 2013-09-20: 2 via ORAL
  Filled 2013-09-20: qty 2

## 2013-09-20 MED ORDER — PENICILLIN V POTASSIUM 500 MG PO TABS
500.0000 mg | ORAL_TABLET | Freq: Once | ORAL | Status: AC
  Administered 2013-09-20: 500 mg via ORAL
  Filled 2013-09-20: qty 1

## 2013-09-20 MED ORDER — HYDROCODONE-ACETAMINOPHEN 5-325 MG PO TABS: 1.0000 | ORAL_TABLET | Freq: Four times a day (QID) | ORAL | Status: DC | PRN

## 2013-09-20 MED ORDER — PENICILLIN V POTASSIUM 500 MG PO TABS
500.0000 mg | ORAL_TABLET | Freq: Four times a day (QID) | ORAL | Status: AC
Start: 2013-09-20 — End: 2013-09-27

## 2013-09-20 NOTE — Discharge Instructions (Signed)
Take penicillin as prescribed. Take naproxen as prescribed for pain. YOu may take Norco for severe pain, if your pain is not controlled by naproxen. Followup with a dentist as soon as possible.  Dental Abscess A dental abscess is a collection of infected fluid (pus) from a bacterial infection in the inner part of the tooth (pulp). It usually occurs at the end of the tooth's root.  CAUSES   Severe tooth decay.  Trauma to the tooth that allows bacteria to enter into the pulp, such as a broken or chipped tooth. SYMPTOMS   Severe pain in and around the infected tooth.  Swelling and redness around the abscessed tooth or in the mouth or face.  Tenderness.  Pus drainage.  Bad breath.  Bitter taste in the mouth.  Difficulty swallowing.  Difficulty opening the mouth.  Nausea.  Vomiting.  Chills.  Swollen neck glands. DIAGNOSIS   A medical and dental history will be taken.  An examination will be performed by tapping on the abscessed tooth.  X-rays may be taken of the tooth to identify the abscess. TREATMENT The goal of treatment is to eliminate the infection. You may be prescribed antibiotic medicine to stop the infection from spreading. A root canal may be performed to save the tooth. If the tooth cannot be saved, it may be pulled (extracted) and the abscess may be drained.  HOME CARE INSTRUCTIONS  Only take over-the-counter or prescription medicines for pain, fever, or discomfort as directed by your caregiver.  Rinse your mouth (gargle) often with salt water ( tsp salt in 8 oz [250 ml] of warm water) to relieve pain or swelling.  Do not drive after taking pain medicine (narcotics).  Do not apply heat to the outside of your face.  Return to your dentist for further treatment as directed. SEEK MEDICAL CARE IF:  Your pain is not helped by medicine.  Your pain is getting worse instead of better. SEEK IMMEDIATE MEDICAL CARE IF:  You have a fever or persistent  symptoms for more than 2-3 days.  You have a fever and your symptoms suddenly get worse.  You have chills or a very bad headache.  You have problems breathing or swallowing.  You have trouble opening your mouth.  You have swelling in the neck or around the eye. Document Released: 01/07/2005 Document Revised: 10/02/2011 Document Reviewed: 04/17/2010 [facility] Patient Information 2015 [place], [place]. This information is not intended to replace advice given to you by your health care provider. Make sure you discuss any questions you have with your health care provider.  Emergency Department Resource Guide 1) Find a Doctor and Pay Out of Pocket Although you won't have to find out who is covered by your insurance plan, it is a good idea to ask around and get recommendations. You will then need to call the office and see if the doctor you have chosen will accept you as a new patient and what types of options they offer for patients who are self-pay. Some doctors offer discounts or will set up payment plans for their patients who do not have insurance, but you will need to ask so you aren't surprised when you get to your appointment.  2) Contact Your Local Health Department Not all health departments have doctors that can see patients for sick visits, but many do, so it is worth a call to see if yours does. If you don't know where your local health department is, you can check in your phone book.  The CDC also has a tool to help you locate your state's health department, and many state websites also have listings of all of their local health departments.  3) Find a [facility] If your illness is not likely to be very severe or complicated, you may want to try a walk in clinic. These are popping up all over the country in pharmacies, drugstores, and shopping centers. They're usually staffed by nurse practitioners or physician assistants that have been trained to treat common illnesses and complaints.  They're usually fairly quick and inexpensive. However, if you have serious medical issues or chronic medical problems, these are probably not your best option.  No Primary Care Doctor: - Call Health Connect at  [phone] - they can help you locate a primary care doctor that  accepts your insurance, provides certain services, etc. - Physician Referral Service- [phone]  Chronic Pain Problems: Organization         Address  Phone   Notes  [facility]  [phone] Patients need to be referred by their primary care doctor.   Medication Assistance: Organization         Address  Phone   Notes  [facility] Medication [facility] [address] [place], [place] [address] [phone] --Must be a resident of [facility] -- Must have NO insurance coverage whatsoever (no Medicaid/ Medicare, etc.) -- The pt. MUST have a primary care doctor that directs their care regularly and follows them in the community   MedAssist  [phone]   [employer]  [phone]    Agencies that provide inexpensive medical care: Organization         Address  Phone   Notes  [place]  [phone]   [facility]    [phone]   [facility], [place] [address] [phone]   [place] [address] [place] [phone]   Planned Parenthood    [phone]   [facility]    [phone]   [place] and [address], [place] Phone:  [phone], Fax:  [phone] Hours of Operation:  9 am - 6 pm, M-F.  Also accepts Medicaid/Medicare and self-pay.  [facility] for [place], [address], [place] Phone: [phone], Fax: [phone]. Hours of Operation:  8:30 am - 5:30 pm, M-F.  Also accepts Medicaid and self-pay.  [facility] [address], [place]  Phone: [phone]   [place], [place], [place] [phone], Ext. [phone] Mondays & Thursdays: 7-9 AM.  First 15 patients are seen on a first come, first serve basis.    [place] Providers:  Organization         Address  Phone   Notes  [facility] [address], [place] [phone] Also accepts self-pay patients.  [facility] [address], [place]  [phone]   [place], [address], [place] [phone]   [facility] [address] [place] [phone]   Lucianne Lei [address], [place]   [phone] Only accepts [place] Access [place] patients after they have their name applied to their card.   Self-Pay (  no insurance) in [facility]:  Organization         Address  Phone   Notes  Sickle Cell Patients, [place] [phone]   [facility] [place] [phone]   [facility] [place]  [place], [place], [place] [phone]   [facility]/Dr. Osei-Bonsu  [address], [place] or [address], [place] [phone] Phone number for both [place] and [place] locations is the same.  [facility] [address], [place] [phone]   [facility] [address], [place] or [address] [phone]   [facility] [address] [place] [phone], phone; [phone], fax Sees patients 1st and 3rd Saturday of every month.  Must not qualify for public or private insurance (i.e. Medicaid, Medicare, [place] Health Choice, Veterans' Benefits)  Household income should be no more than 200% of the poverty level The clinic cannot  treat you if you are pregnant or think you are pregnant  Sexually transmitted diseases are not treated at the clinic.    Dental Care: Organization         Address  Phone  Notes  [facility] Department of [facility] [place] [phone] Accepts children up to age 14 who are enrolled in [place] or [place]; pregnant women with a Medicaid card; and children who have applied for Medicaid or [place] Health Choice, but were declined, whose parents can pay a reduced fee at time of service.  [facility] Department of [facility]  [address], [place] [phone] Accepts children up to age 44 who are enrolled in [place] or [place]; pregnant women with a Medicaid card; and children who have applied for Medicaid or [place] Health Choice, but were declined, whose parents can pay a reduced fee at time of service.  [place] Adult Dental Access PROGRAM  [place] [phone] Patients are seen by appointment only. Walk-ins are not accepted. [place] will see patients 61 years of age and older. Monday - Tuesday (8am-5pm) Most Wednesdays (8:30-5pm) $30 per visit, cash only  [facility] Adult Dental Access PROGRAM  [address], [facility] [phone] Patients are seen by appointment only. Walk-ins are not accepted. [place] will see patients 31 years of age and older. One Wednesday Evening (Monthly: Volunteer Based).  $30 per visit, cash only  [place]  [phone] for adults; Children under age 87, call Graduate Pediatric Dentistry at [phone]. Children aged 58-14, please call [phone] to request a pediatric application.  Dental services are provided in all areas of dental care including fillings, crowns and bridges, complete and partial dentures, implants, gum treatment, root canals, and extractions. Preventive care is also provided.  Treatment is provided to both adults and children. Patients are selected via a lottery and there is often a waiting list.   [facility] [address] [place]  [phone] [email or web address]   [facility] [address] [place], [place] [phone], Ext. [phone] Second and Fourth Thursday of each month, opens at 6:30 AM; Clinic ends at 9 AM.  Patients are seen on a first-come first-served basis, and a limited number are  seen during each clinic.   [facility]  [address] Hillard Danker [place], [place] [phone]   Eligibility Requirements You must have lived in [place], [place], or [place] counties for at least the last three months.   You cannot be eligible for state or federal sponsored [employer], including [employer], [place], or [employer].   You generally cannot be eligible for healthcare insurance through your employer.    How to apply: Eligibility screenings are held every Tuesday and Wednesday afternoon from 1:00 pm until 4:00 pm. You do not need an appointment for the interview!  [facility] [address] [place], [place]   [place]  [place] Department  [place]  [phone]    Behavioral Health Resources in the Community: Intensive Outpatient Programs Organization         Address  Phone  Notes  [place]. [address] [place], [place] [phone]   [facility] Outpatient [address], [place], [place]   ADS: Alcohol [facility] Drug [facility] [address] [place], [place]   [place] [address]  [place], [place] or [phone]   Substance Abuse Resources Organization         Address  Phone  Notes  Alcohol and Drug Services  [phone]   [place]   [phone]   [facility] [place]   Chinita Pester  [phone]   Residential & Outpatient Substance Abuse Program  [phone]   Psychological Services Organization         Address  Phone  Notes  [facility]  [place]  [phone]   [place] [address] [place] or [phone]    Mobile Crisis Teams Organization         Address  Phone  Notes  [facility], Mobile Crisis Care Unit  [phone]   [facility]  [address] [place], [place]   [place] [address], [place] [phone]    Self-Help/Support Groups Organization         Address  Phone             Notes  [place] of [place] - variety of support groups  [place] Call for more information  Narcotics Anonymous (NA), [facility] [address], [employer] [place]  2 meetings at this location   Special educational needs teacher         Address  Phone  Notes  [facility] [place],    [place]  [phone]   [facility]  [address] [place] [phone], [place], [place]   [place], [place] [phone] Admissions: 8am-3pm M-F  Incentives Substance [place] [address]    [place], [place] [phone]   [facility] [address] Jadene Pierini [place], [place]   [facility] [address],  [place], [place]   [facility] - Intensive Outpatient [address] Kristeen [address], [place], [place]   [facility] ([place] [place].,  [place], [place] or [phone]   [facility] (RTS) [address], [place], [place] Accepts Medicaid  [facility] [place].,  [place] [phone] Substance  Abuse/Addiction Treatment   [facility] Organization         Address  Phone  Notes  [facility]  [phone]   Domenic Schwab, PhD [address] Arlis [place], [place]   [phone] or [phone]   [place] Preston [place], [place] [phone]   [address], [place], [place] [phone] Insurance/Medicaid/sponsorship through [facility] and Families [address], [address] [place]                                    [place], [place] [phone] [place] [address], [place] [phone]    Dr. Adele Schilder  [phone]   [facility] Dept. 1) [address], [place] 2) [place] 3)  [place] [address], [place] [phone]  [phone]   [place] [phone] or [phone] (After Hours)

## 2013-09-20 NOTE — ED Provider Notes (Signed)
CSN: [id number]     Arrival date & time 09/20/13  1841 History  This chart was scribed for non-physician practitioner working with [name], MD by Mercy Moore, ED Scribe. This patient was seen in room WTR7/WTR7 and the patient's care was started at 9:22 PM.   Chief Complaint  Patient presents with  . Dental Pain    The history is provided by the patient. No language interpreter was used.   HPI Comments: Regina Newman is a 28 y.o. female who presents to the Emergency Department complaining of severe right lower dental pain, ongoing for two days. Patient reports right lower facial swelling upon waking yesterday. The swelling has shown mild improvement today, but patient reports continued pain with in her lower gum and right ear.  Patient denies bleeding in her mouth, inability to swallow, shortness of breath, or fever. Patient has insurance.   Past Medical History  Diagnosis Date  . Trichomonas   . Anemia    Past Surgical History  Procedure Laterality Date  . Wisdom tooth extraction    . No past surgeries     History reviewed. No pertinent family history. History  Substance Use Topics  . Smoking status: Current Every Day Smoker -- 0.50 packs/day for 10 years    Types: Cigarettes  . Smokeless tobacco: Never Used  . Alcohol Use: No     Comment: few days a week   OB History   Grav Para Term Preterm Abortions TAB SAB Ect Mult Living   3 3 2 1      2      Review of Systems  Constitutional: Negative for fever.  HENT: Positive for dental problem, ear pain and facial swelling. Negative for sore throat and trouble swallowing.   Respiratory: Negative for shortness of breath.   All other systems reviewed and are negative.   Allergies  Review of patient's allergies indicates no known allergies.  Home Medications   Prior to Admission medications   Medication Sig Start Date End Date Taking? Authorizing Provider  HYDROcodone-acetaminophen (NORCO/VICODIN) 5-325 MG per tablet  Take 1-2 tablets by mouth every 6 (six) hours as needed for moderate pain or severe pain. 09/20/13   Antonietta Breach, PA-C  naproxen (NAPROSYN) 500 MG tablet Take 1 tablet (500 mg total) by mouth 2 (two) times daily. 09/20/13   Antonietta Breach, PA-C  penicillin v potassium (VEETID) 500 MG tablet Take 1 tablet (500 mg total) by mouth 4 (four) times daily. 09/20/13 09/27/13  Antonietta Breach, PA-C   Triage Vitals: BP 129/85  Pulse 104  Temp(Src) 99.8 F (37.7 C) (Oral)  Resp 14  SpO2 100%  LMP 08/04/2013  Physical Exam  Nursing note and vitals reviewed. Constitutional: She is oriented to person, place, and time. She appears well-developed and well-nourished. No distress.  Nontoxic/nonseptic appearing  HENT:  Head: Normocephalic and atraumatic.  Right Ear: External ear normal. No mastoid tenderness.  Nose: Nose normal.  Mouth/Throat: Uvula is midline, oropharynx is clear and moist and mucous membranes are normal. No oral lesions. No trismus in the jaw. Abnormal dentition. Dental caries present. No uvula swelling.    Mild right lower facial swelling. Dental caries present. There is gingival swelling without fluctuance. Uvula midline and patient tolerating secretions without difficulty. No trismus.  Eyes: Conjunctivae and EOM are normal. No scleral icterus.  Neck: Normal range of motion. Neck supple.  No nuchal rigidity or meningismus  Pulmonary/Chest: Effort normal. No stridor. No respiratory distress.  Musculoskeletal: Normal range of motion.  Neurological: She is alert and oriented to person, place, and time. She exhibits normal muscle tone. Coordination normal.  Skin: Skin is warm and dry. No rash noted. She is not diaphoretic. No erythema. No pallor.  Psychiatric: She has a normal mood and affect. Her behavior is normal.    ED Course  Procedures (including critical care time)  COORDINATION OF CARE: 9:26 PM- Will prescribe antibiotic and pain medication. Referring patient to dentist. Discussed  treatment plan with patient at bedside and patient agreed to plan.   Labs Review Labs Reviewed - No data to display  Imaging Review No results found.   EKG Interpretation None      MDM   Final diagnoses:  Dentalgia  Gingival swelling    Patient with toothache with facial swelling. No gingival fluctuance to suggest gross abscess; mild swelling is appreciated. Exam unconcerning for Ludwig's angina or spread of infection. Will treat with penicillin and pain medicine. Urged patient to follow-up with dentist. Referral and resource guide provided. Patient agreeable to plan with no unaddressed concerns.  I personally performed the services described in this documentation, which was scribed in my presence. The recorded information has been reviewed and is accurate.   Filed Vitals:   09/20/13 1921  BP: 129/85  Pulse: 104  Temp: 99.8 F (37.7 C)  TempSrc: Oral  Resp: 14  SpO2: 100%     Antonietta Breach, PA-C 09/20/13 2158

## 2013-09-20 NOTE — ED Notes (Signed)
Pt states she started having dental pain x 2 days ago. R side of face is visibly swollen. Alert and oriented.

## 2013-09-20 NOTE — ED Provider Notes (Signed)
Medical screening examination/treatment/procedure(s) were performed by non-physician practitioner and as supervising physician I was immediately available for consultation/collaboration.   EKG Interpretation None     Rolland Porter, MD, Abram Sander   [name], MD 09/20/13 2247

## 2013-11-22 ENCOUNTER — Encounter (HOSPITAL_COMMUNITY): Payer: Self-pay | Admitting: Emergency Medicine

## 2014-06-10 IMAGING — US US OB DETAIL+14 WK
1 series · 12 of 28 positions shown · non-contrast
Comparison: none

[Series 1: us ob detail+14 wk · 0.23mm/px · 12 of 87 slices shown]
[im 4/87]
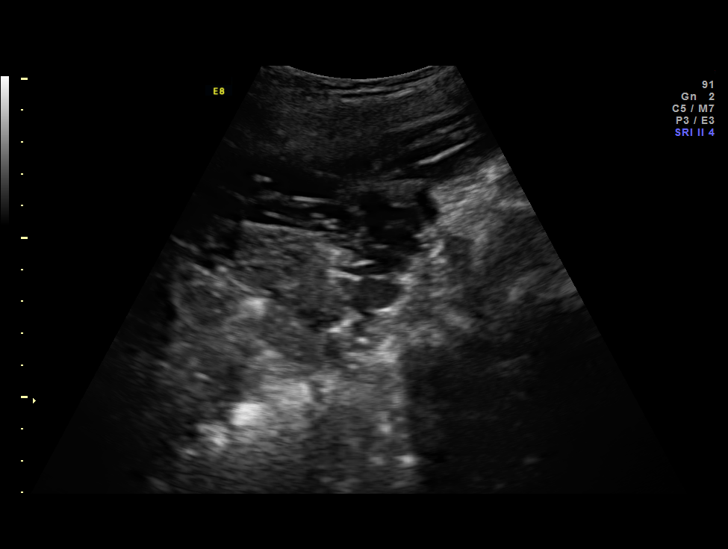
[im 10/87]
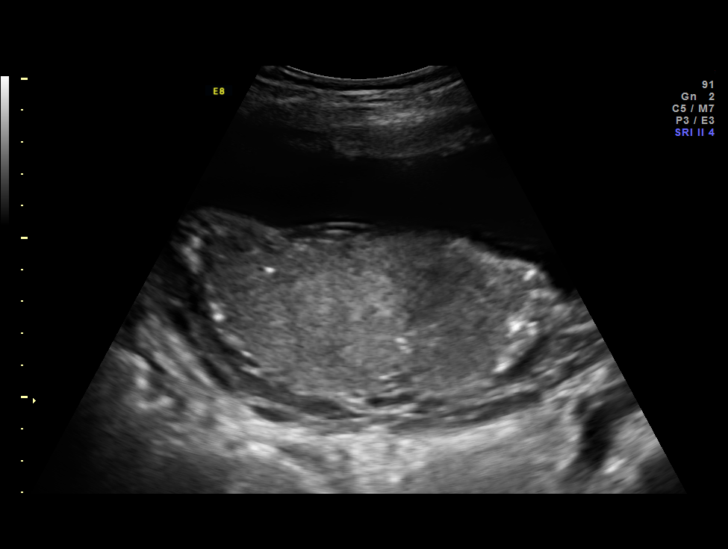
[im 16/87]
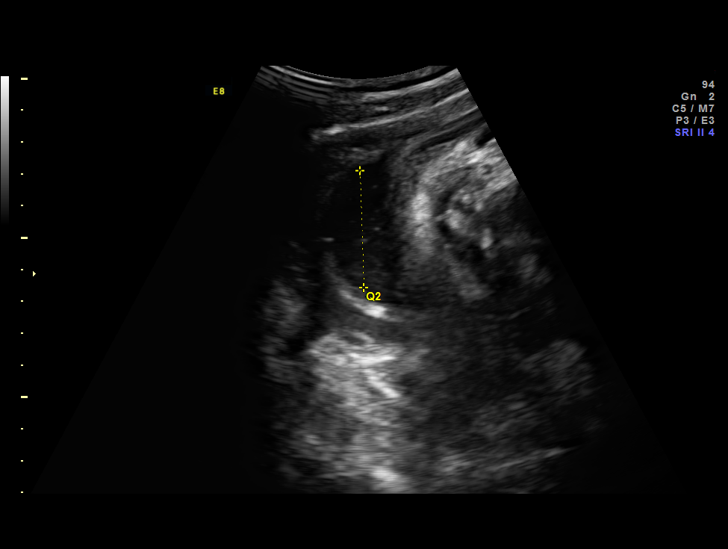
[im 26/87]
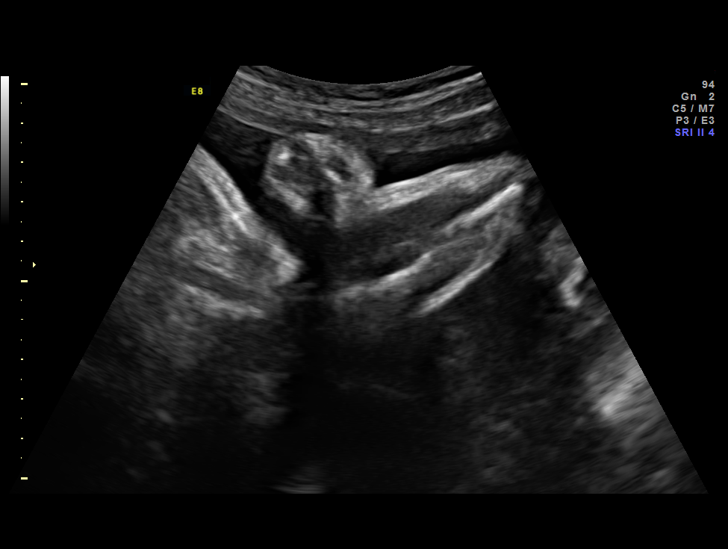
[im 32/87]
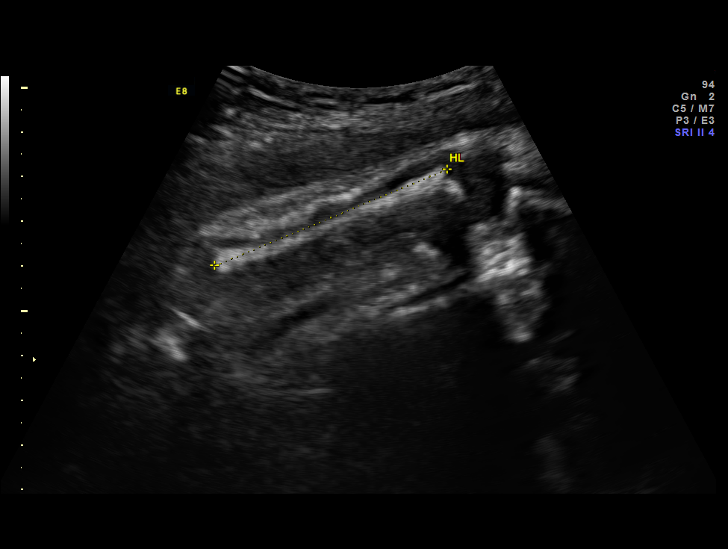
[im 39/87]
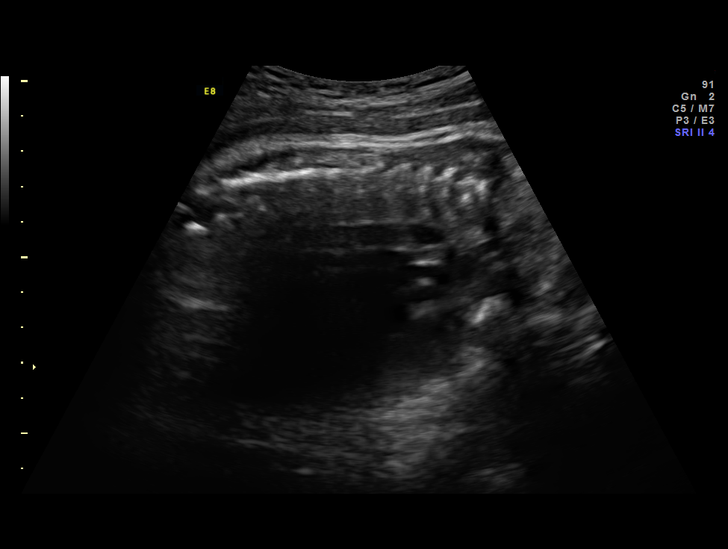
[im 48/87]
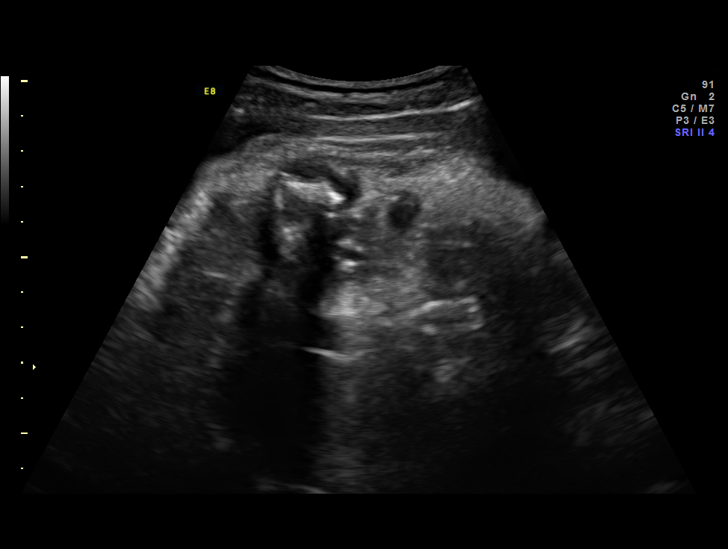
[im 55/87]
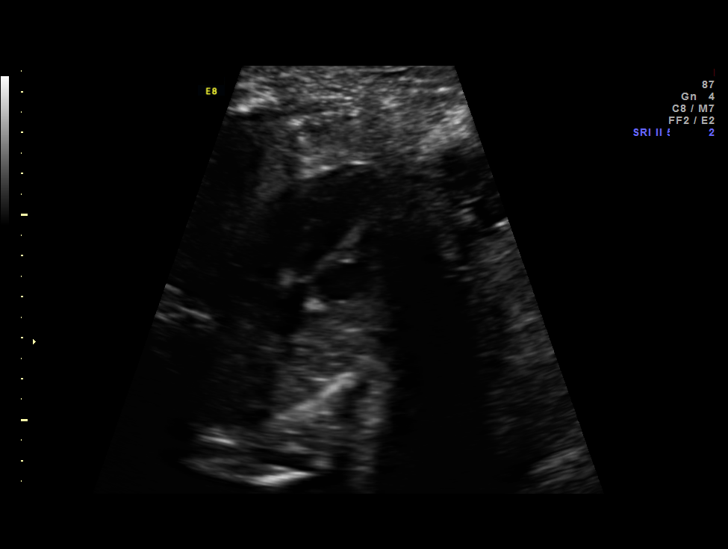
[im 61/87]
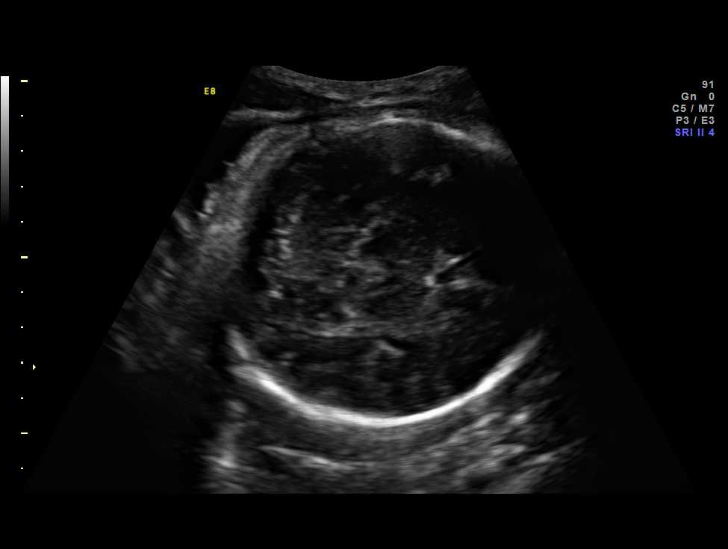
[im 71/87]
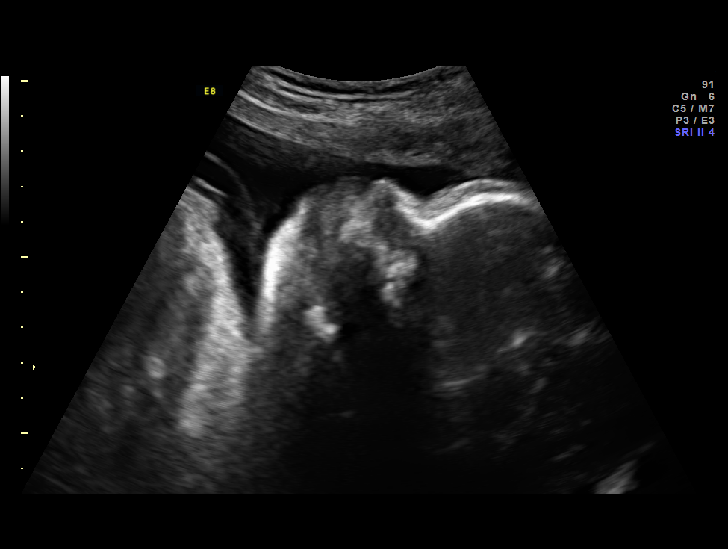
[im 77/87]
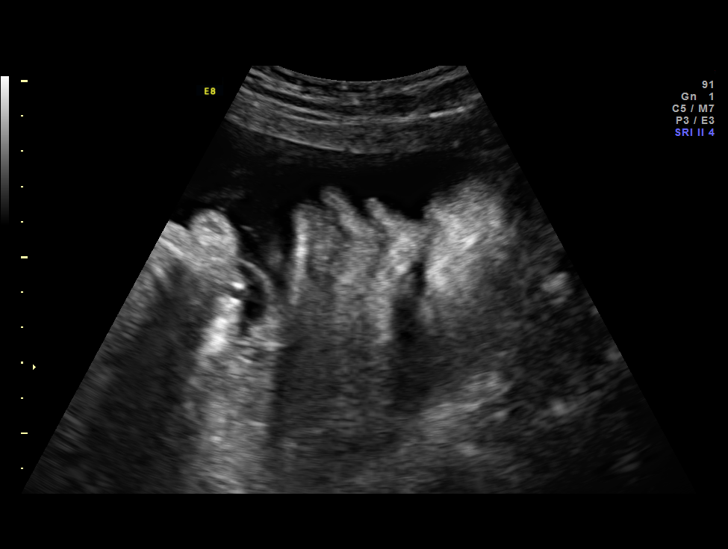
[im 83/87]
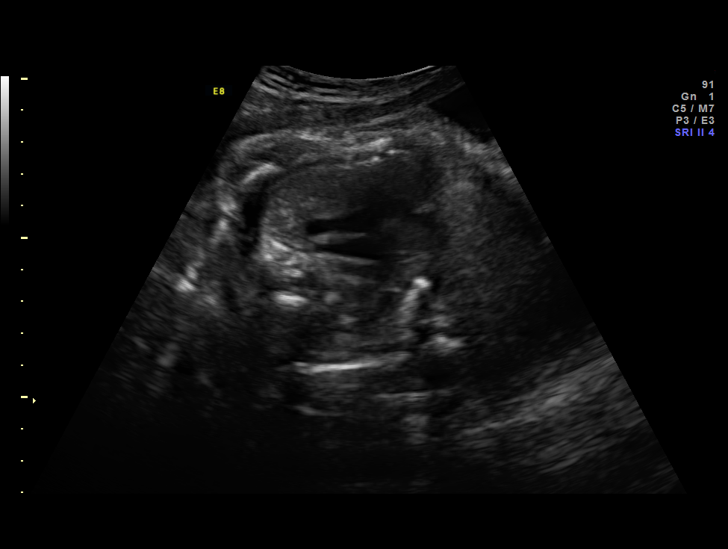

[12 of 28 positions shown; findings below may reference images not displayed]

OBSTETRICS REPORT
                      (Signed Final 03/03/2012 [DATE])

Service(s) Provided

 US OB DETAIL + 14 WK                                  76811.0
Indications

 Detailed fetal anatomic survey
 No or Little Prenatal Care
 Poor obstetric history: Previous IUFD (stillbirth)
 Cigarette smoker
 Uncertain LMP;  Establish Gestational [AGE]
Fetal Evaluation

 Num Of Fetuses:    1
 Fetal Heart Rate:  135                          bpm
 Cardiac Activity:  Observed
 Presentation:      Cephalic
 Placenta:          Posterior, above cervical
                    os
 P. Cord            Visualized
 Insertion:

 Amniotic Fluid
 AFI FV:      Subjectively within normal limits
 AFI Sum:     12.29   cm       36  %Tile     Larg Pckt:    3.67  cm
 RUQ:   2.76    cm   RLQ:    3.26   cm    LUQ:   3.67    cm   LLQ:    2.6    cm
Biometry

 BPD:     82.9  mm     G. Age:  33w 3d                CI:         74.0   70 - 86
 OFD:    112.1  mm                                    FL/HC:      20.5   19.4 -

 HC:     311.2  mm     G. Age:  34w 6d       38  %    HC/AC:      1.03   0.96 -

 AC:     302.5  mm     G. Age:  34w 2d       63  %    FL/BPD:     76.8   71 - 87
 FL:      63.7  mm     G. Age:  33w 0d       19  %    FL/AC:      21.1   20 - 24
 HUM:     56.2  mm     G. Age:  32w 5d       33  %
 CER:       45  mm     G. Age:  39w 0d     > 95  %

 Est. FW:    6626  gm      5 lb 1 oz     58  %
Gestational Age

 U/S Today:     33w 6d                                        EDD:   04/15/12
 Best:          33w 6d     Det. By:  U/S (03/03/12)           EDD:   04/15/12
Anatomy

 Cranium:          Appears normal         Aortic Arch:      Appears normal
 Fetal Cavum:      Appears normal         Ductal Arch:      Appears normal
 Ventricles:       Appears normal         Diaphragm:        Appears normal
 Choroid Plexus:   Appears normal         Stomach:          Appears normal, left
                                                            sided
 Cerebellum:       Appears normal         Abdomen:          Appears normal
 Posterior Fossa:  Appears normal         Abdominal Wall:   Not well visualized
 Nuchal Fold:      Not applicable (>20    Cord Vessels:     Appears normal (3
                   wks GA)                                  vessel cord)
 Face:             Appears normal         Kidneys:          Appear normal
                   (orbits and profile)
 Lips:             Appears normal         Bladder:          Appears normal
 Heart:            Appears normal         Spine:            Appears normal
                   (4CH, axis, and
                   situs)
 RVOT:             Appears normal         Lower             Present
                                          Extremities:
 LVOT:             Appears normal         Upper             Present
                                          Extremities:

 Other:  Fetus appears to be a female.
Cervix Uterus Adnexa

 Cervix:       Not visualized (advanced GA >83wks)
 Left Ovary:    Not visualized. No adnexal mass visualized.
 Right Ovary:   Within normal limits.
Impression

 Single IUP at 33 [DATE] weeks
 Late prenatal care, hx of 23 week IUFD due to abruption
 Normal anatomic fetal survey
 Estimated fetal weight at the 58th %tile
 Normal amniotic fluid volume
Recommendations

 Recommend follow-up ultrasound examination in 3 weeks for
 interval growth
 2x weekly NSTs with weekly AFI
 See separate consult

## 2017-01-21 NOTE — L&D Delivery Note (Addendum)
Patient: Regina Newman MRN: [id number]  GBS status: unknown, IAP given: ampicillin x1  Patient is a 32 y.o. now [id number] s/p NSVD at 100w5d, who was admitted for PPROM. SROM 3h 35m prior to delivery with thin meconium stained fluid.   Delivery Note At 2:03 PM a viable female was delivered via Vaginal, Spontaneous (Presentation: ROA).  APGAR: 8, 9; weight  .   Placenta status: intact, multiple calcifications Cord: three vessel, with the following complications: none.    NICU was present at delivery for prematurity. Head delivered ROA. No nuchal cord present. Shoulder and body delivered in usual fashion. Infant with spontaneous cry, placed on mother's abdomen, dried and bulb suctioned. Cord clamped x 2 after 1-minute delay, and cut by delivering physician. Cord blood drawn. Placenta delivered spontaneously with gentle cord traction. Fundus firm with massage and Pitocin. Perineum inspected and found to have small, hemostatic periurethral laceration, which was found to be hemostatic, and did not require repair.  Anesthesia: epidural Episiotomy: No Lacerations: Periurethral abrasion Suture Repair: NA Est. Blood Loss (mL): 50  Mom to postpartum.  Baby to Couplet care / Skin to Skin.  Bridgid Rosalva Ferron 01/19/2018, 2:29 PM  Attestation: I have seen this patient and agree with the resident's documentation. I was present for the entire delivery. No complications, fundus firm and bleeding well-controlled after delivery.   Lambert Mody. Juleen China, DO OB/GYN Fellow

## 2017-12-02 ENCOUNTER — Ambulatory Visit (INDEPENDENT_AMBULATORY_CARE_PROVIDER_SITE_OTHER): Payer: Medicaid Other | Admitting: General Practice

## 2017-12-02 DIAGNOSIS — Z3201 Encounter for pregnancy test, result positive: Secondary | ICD-10-CM | POA: Diagnosis present

## 2017-12-02 DIAGNOSIS — Z8759 Personal history of other complications of pregnancy, childbirth and the puerperium: Secondary | ICD-10-CM

## 2017-12-02 DIAGNOSIS — O0932 Supervision of pregnancy with insufficient antenatal care, second trimester: Secondary | ICD-10-CM

## 2017-12-02 LAB — POCT PREGNANCY, URINE: Preg Test, Ur: POSITIVE — AB

## 2017-12-02 MED ORDER — PRENATAL GUMMIES/DHA & FA 0.4-32.5 MG PO CHEW: 1.0000 | CHEWABLE_TABLET | Freq: Every day | ORAL | 11 refills | Status: DC

## 2017-12-02 NOTE — Progress Notes (Signed)
Patient presents to office today for UPT. UPT +. Patient reports first positive home test in late June. Patient is unsure of LMP. Patient denies taking any meds/vitamins. PNV sent to pharmacy and scheduled patient OB detail ultrasound for 11/20. Patient declines initial OB blood work today, will have done at new OB appt. Patient had no questions.  Derinda Late RN BSN 12/02/17

## 2017-12-03 ENCOUNTER — Encounter (HOSPITAL_COMMUNITY): Payer: Self-pay

## 2017-12-04 NOTE — Progress Notes (Signed)
I reviewed the note and agree with the nursing assessment and plan.   Alleah Dearman, CNM 04/18/2017 10:32 AM   

## 2017-12-09 ENCOUNTER — Encounter (HOSPITAL_COMMUNITY): Payer: Self-pay

## 2017-12-10 ENCOUNTER — Other Ambulatory Visit: Payer: Self-pay | Admitting: Advanced Practice Midwife

## 2017-12-10 ENCOUNTER — Ambulatory Visit (HOSPITAL_COMMUNITY)
Admission: RE | Admit: 2017-12-10 | Discharge: 2017-12-10 | Disposition: A | Discharge status: Home / Self Care | Payer: Medicaid Other | Source: Ambulatory Visit | Attending: Advanced Practice Midwife | Admitting: Advanced Practice Midwife

## 2017-12-10 ENCOUNTER — Encounter (HOSPITAL_COMMUNITY): Payer: Self-pay | Admitting: *Deleted

## 2017-12-10 DIAGNOSIS — O09293 Supervision of pregnancy with other poor reproductive or obstetric history, third trimester: Secondary | ICD-10-CM | POA: Insufficient documentation

## 2017-12-10 DIAGNOSIS — Z363 Encounter for antenatal screening for malformations: Secondary | ICD-10-CM | POA: Diagnosis not present

## 2017-12-10 DIAGNOSIS — Z3A32 32 weeks gestation of pregnancy: Secondary | ICD-10-CM | POA: Insufficient documentation

## 2017-12-10 DIAGNOSIS — Z8759 Personal history of other complications of pregnancy, childbirth and the puerperium: Secondary | ICD-10-CM

## 2017-12-10 DIAGNOSIS — O0932 Supervision of pregnancy with insufficient antenatal care, second trimester: Secondary | ICD-10-CM

## 2017-12-10 DIAGNOSIS — O0933 Supervision of pregnancy with insufficient antenatal care, third trimester: Secondary | ICD-10-CM | POA: Diagnosis not present

## 2017-12-10 DIAGNOSIS — O09299 Supervision of pregnancy with other poor reproductive or obstetric history, unspecified trimester: Secondary | ICD-10-CM | POA: Diagnosis present

## 2017-12-11 ENCOUNTER — Other Ambulatory Visit (HOSPITAL_COMMUNITY): Payer: Self-pay | Admitting: *Deleted

## 2017-12-11 DIAGNOSIS — O09292 Supervision of pregnancy with other poor reproductive or obstetric history, second trimester: Secondary | ICD-10-CM

## 2017-12-15 ENCOUNTER — Encounter (HOSPITAL_COMMUNITY): Payer: Self-pay

## 2017-12-16 ENCOUNTER — Ambulatory Visit (HOSPITAL_COMMUNITY): Admission: RE | Admit: 2017-12-16 | Payer: Medicaid Other | Source: Ambulatory Visit

## 2017-12-16 ENCOUNTER — Encounter (HOSPITAL_COMMUNITY): Payer: Self-pay

## 2017-12-23 ENCOUNTER — Telehealth: Payer: Self-pay | Admitting: Family Medicine

## 2017-12-23 NOTE — Telephone Encounter (Signed)
Patient's phone is not working.

## 2018-01-19 ENCOUNTER — Encounter (HOSPITAL_COMMUNITY): Payer: Self-pay

## 2018-01-19 ENCOUNTER — Other Ambulatory Visit: Payer: Self-pay

## 2018-01-19 ENCOUNTER — Inpatient Hospital Stay (HOSPITAL_COMMUNITY)
Admission: AD | Admit: 2018-01-19 | Discharge: 2018-01-21 | DRG: 806 | Disposition: A | Discharge status: Home / Self Care | Payer: Medicaid Other | Attending: Obstetrics and Gynecology | Admitting: Obstetrics and Gynecology

## 2018-01-19 ENCOUNTER — Inpatient Hospital Stay (HOSPITAL_COMMUNITY): Payer: Medicaid Other | Admitting: Anesthesiology

## 2018-01-19 DIAGNOSIS — Z23 Encounter for immunization: Secondary | ICD-10-CM | POA: Diagnosis not present

## 2018-01-19 DIAGNOSIS — F17210 Nicotine dependence, cigarettes, uncomplicated: Secondary | ICD-10-CM | POA: Diagnosis present

## 2018-01-19 DIAGNOSIS — Z3A36 36 weeks gestation of pregnancy: Secondary | ICD-10-CM | POA: Diagnosis not present

## 2018-01-19 DIAGNOSIS — O9902 Anemia complicating childbirth: Secondary | ICD-10-CM | POA: Diagnosis present

## 2018-01-19 DIAGNOSIS — Z91199 Patient's noncompliance with other medical treatment and regimen due to unspecified reason: Secondary | ICD-10-CM

## 2018-01-19 DIAGNOSIS — O42013 Preterm premature rupture of membranes, onset of labor within 24 hours of rupture, third trimester: Secondary | ICD-10-CM

## 2018-01-19 DIAGNOSIS — Z9119 Patient's noncompliance with other medical treatment and regimen: Secondary | ICD-10-CM

## 2018-01-19 DIAGNOSIS — F1290 Cannabis use, unspecified, uncomplicated: Secondary | ICD-10-CM | POA: Diagnosis present

## 2018-01-19 DIAGNOSIS — Z720 Tobacco use: Secondary | ICD-10-CM

## 2018-01-19 DIAGNOSIS — O09299 Supervision of pregnancy with other poor reproductive or obstetric history, unspecified trimester: Secondary | ICD-10-CM

## 2018-01-19 DIAGNOSIS — O42913 Preterm premature rupture of membranes, unspecified as to length of time between rupture and onset of labor, third trimester: Secondary | ICD-10-CM | POA: Diagnosis present

## 2018-01-19 DIAGNOSIS — O42919 Preterm premature rupture of membranes, unspecified as to length of time between rupture and onset of labor, unspecified trimester: Secondary | ICD-10-CM | POA: Diagnosis present

## 2018-01-19 DIAGNOSIS — D649 Anemia, unspecified: Secondary | ICD-10-CM | POA: Diagnosis present

## 2018-01-19 DIAGNOSIS — O99334 Smoking (tobacco) complicating childbirth: Secondary | ICD-10-CM | POA: Diagnosis present

## 2018-01-19 DIAGNOSIS — O99324 Drug use complicating childbirth: Secondary | ICD-10-CM | POA: Diagnosis present

## 2018-01-19 LAB — COMPREHENSIVE METABOLIC PANEL
ALT: 16 U/L (ref 0–44)
AST: 26 U/L (ref 15–41)
Albumin: 3 g/dL — ABNORMAL LOW (ref 3.5–5.0)
Alkaline Phosphatase: 206 U/L — ABNORMAL HIGH (ref 38–126)
Anion gap: 13 (ref 5–15)
BUN: <5 mg/dL — ABNORMAL LOW (ref 6–20)
CO2: 21 mmol/L — ABNORMAL LOW (ref 22–32)
Calcium: 9.1 mg/dL (ref 8.9–10.3)
Chloride: 102 mmol/L (ref 98–111)
Creatinine, Ser: 0.76 mg/dL (ref 0.44–1.00)
GFR calc Af Amer: >60 mL/min (ref >60)
GFR calc non Af Amer: >60 mL/min (ref >60)
Glucose, Bld: 85 mg/dL (ref 70–99)
Potassium: 3.5 mmol/L (ref 3.5–5.1)
Sodium: 136 mmol/L (ref 135–145)
Total Bilirubin: 0.5 mg/dL (ref 0.3–1.2)
Total Protein: 7.8 g/dL (ref 6.5–8.1)

## 2018-01-19 LAB — TYPE AND SCREEN
ABO/RH(D): O POS
ABO/RH(D): O POS
Antibody Screen: NEGATIVE
Antibody Screen: NEGATIVE

## 2018-01-19 LAB — CBC
HCT: 32.8 % — ABNORMAL LOW (ref 36.0–46.0)
HCT: 27.5 % — ABNORMAL LOW (ref 36.0–46.0)
HEMOGLOBIN: 9.9 g/dL — AB (ref 12.0–15.0)
Hemoglobin: 8.7 g/dL — ABNORMAL LOW (ref 12.0–15.0)
MCH: 24.5 pg — ABNORMAL LOW (ref 26.0–34.0)
MCH: 25.2 pg — ABNORMAL LOW (ref 26.0–34.0)
MCHC: 30.2 g/dL (ref 30.0–36.0)
MCHC: 31.6 g/dL (ref 30.0–36.0)
MCV: 81.2 fL (ref 80.0–100.0)
MCV: 79.7 fL — ABNORMAL LOW (ref 80.0–100.0)
NRBC: 1.3 % — AB (ref 0.0–0.2)
PLATELETS: 279 10*3/uL (ref 150–400)
Platelets: 243 10*3/uL (ref 150–400)
RBC: 4.04 MIL/uL (ref 3.87–5.11)
RBC: 3.45 MIL/uL — ABNORMAL LOW (ref 3.87–5.11)
RDW: 17.1 % — ABNORMAL HIGH (ref 11.5–15.5)
RDW: 17 % — ABNORMAL HIGH (ref 11.5–15.5)
WBC: 11.3 10*3/uL — AB (ref 4.0–10.5)
WBC: 9.7 10*3/uL (ref 4.0–10.5)
nRBC: 1.2 % — ABNORMAL HIGH (ref 0.0–0.2)

## 2018-01-19 LAB — ABO/RH: ABO/RH(D): O POS

## 2018-01-19 LAB — RAPID URINE DRUG SCREEN, HOSP PERFORMED
Amphetamines: NOT DETECTED
Barbiturates: NOT DETECTED
Benzodiazepines: NOT DETECTED
Cocaine: NOT DETECTED
Opiates: NOT DETECTED
Tetrahydrocannabinol: POSITIVE — AB

## 2018-01-19 LAB — HEPATITIS B SURFACE ANTIGEN: HEP B S AG: NEGATIVE

## 2018-01-19 MED ORDER — OXYCODONE-ACETAMINOPHEN 5-325 MG PO TABS: 2.0000 | ORAL_TABLET | ORAL | Status: DC | PRN

## 2018-01-19 MED ORDER — PHENYLEPHRINE 40 MCG/ML (10ML) SYRINGE FOR IV PUSH (FOR BLOOD PRESSURE SUPPORT)
80.0000 ug | PREFILLED_SYRINGE | INTRAVENOUS | Status: DC | PRN
  Filled 2018-01-19: qty 10

## 2018-01-19 MED ORDER — EPHEDRINE 5 MG/ML INJ
10.0000 mg | INTRAVENOUS | Status: DC | PRN
  Filled 2018-01-19: qty 2

## 2018-01-19 MED ORDER — FLEET ENEMA 7-19 GM/118ML RE ENEM: 1.0000 | ENEMA | RECTAL | Status: DC | PRN

## 2018-01-19 MED ORDER — OXYTOCIN 40 UNITS IN LACTATED RINGERS INFUSION - SIMPLE MED
2.5000 [IU]/h | INTRAVENOUS | Status: DC
  Administered 2018-01-19: 2.5 [IU]/h via INTRAVENOUS
  Filled 2018-01-19: qty 1000

## 2018-01-19 MED ORDER — FENTANYL 2.5 MCG/ML BUPIVACAINE 1/10 % EPIDURAL INFUSION (WH - ANES)
14.0000 mL/h | INTRAMUSCULAR | Status: DC | PRN
  Administered 2018-01-19: 14 mL/h via EPIDURAL

## 2018-01-19 MED ORDER — FENTANYL 2.5 MCG/ML BUPIVACAINE 1/10 % EPIDURAL INFUSION (WH - ANES)
INTRAMUSCULAR | Status: AC
  Filled 2018-01-19: qty 100

## 2018-01-19 MED ORDER — OXYCODONE-ACETAMINOPHEN 5-325 MG PO TABS: 1.0000 | ORAL_TABLET | ORAL | Status: DC | PRN

## 2018-01-19 MED ORDER — ACETAMINOPHEN 325 MG PO TABS: 650.0000 mg | ORAL_TABLET | ORAL | Status: DC | PRN

## 2018-01-19 MED ORDER — TETANUS-DIPHTH-ACELL PERTUSSIS 5-2.5-18.5 LF-MCG/0.5 IM SUSP
0.5000 mL | Freq: Once | INTRAMUSCULAR | Status: AC
  Administered 2018-01-20: 0.5 mL via INTRAMUSCULAR

## 2018-01-19 MED ORDER — OXYTOCIN BOLUS FROM INFUSION
500.0000 mL | Freq: Once | INTRAVENOUS | Status: AC
  Administered 2018-01-19: 500 mL via INTRAVENOUS

## 2018-01-19 MED ORDER — SODIUM CHLORIDE 0.9 % IV SOLN
2.0000 g | Freq: Once | INTRAVENOUS | Status: AC
  Administered 2018-01-19: 2 g via INTRAVENOUS
  Filled 2018-01-19: qty 2000

## 2018-01-19 MED ORDER — SOD CITRATE-CITRIC ACID 500-334 MG/5ML PO SOLN: 30.0000 mL | ORAL | Status: DC | PRN

## 2018-01-19 MED ORDER — DIBUCAINE 1 % RE OINT: 1.0000 "application " | TOPICAL_OINTMENT | RECTAL | Status: DC | PRN

## 2018-01-19 MED ORDER — SENNOSIDES-DOCUSATE SODIUM 8.6-50 MG PO TABS
2.0000 | ORAL_TABLET | ORAL | Status: DC
  Administered 2018-01-19 – 2018-01-20 (×2): 2 via ORAL
  Filled 2018-01-19 (×2): qty 2

## 2018-01-19 MED ORDER — PHENYLEPHRINE 40 MCG/ML (10ML) SYRINGE FOR IV PUSH (FOR BLOOD PRESSURE SUPPORT)
PREFILLED_SYRINGE | INTRAVENOUS | Status: AC
  Filled 2018-01-19: qty 10

## 2018-01-19 MED ORDER — BENZOCAINE-MENTHOL 20-0.5 % EX AERO: 1.0000 "application " | INHALATION_SPRAY | CUTANEOUS | Status: DC | PRN

## 2018-01-19 MED ORDER — ONDANSETRON HCL 4 MG/2ML IJ SOLN: 4.0000 mg | Freq: Four times a day (QID) | INTRAMUSCULAR | Status: DC | PRN

## 2018-01-19 MED ORDER — LACTATED RINGERS IV BOLUS
1000.0000 mL | Freq: Once | INTRAVENOUS | Status: AC
  Administered 2018-01-19: 1000 mL via INTRAVENOUS

## 2018-01-19 MED ORDER — FENTANYL CITRATE (PF) 100 MCG/2ML IJ SOLN: 50.0000 ug | INTRAMUSCULAR | Status: DC | PRN

## 2018-01-19 MED ORDER — LIDOCAINE HCL (PF) 1 % IJ SOLN
30.0000 mL | INTRAMUSCULAR | Status: DC | PRN
  Filled 2018-01-19: qty 30

## 2018-01-19 MED ORDER — PENICILLIN G 3 MILLION UNITS IVPB - SIMPLE MED: 3.0000 10*6.[IU] | INTRAVENOUS | Status: DC

## 2018-01-19 MED ORDER — DIPHENHYDRAMINE HCL 50 MG/ML IJ SOLN: 12.5000 mg | INTRAMUSCULAR | Status: DC | PRN

## 2018-01-19 MED ORDER — SODIUM CHLORIDE 0.9 % IV SOLN
5.0000 10*6.[IU] | Freq: Once | INTRAVENOUS | Status: DC
  Filled 2018-01-19: qty 5

## 2018-01-19 MED ORDER — ONDANSETRON HCL 4 MG PO TABS: 4.0000 mg | ORAL_TABLET | ORAL | Status: DC | PRN

## 2018-01-19 MED ORDER — LACTATED RINGERS IV SOLN
500.0000 mL | Freq: Once | INTRAVENOUS | Status: AC
  Administered 2018-01-19: 500 mL via INTRAVENOUS

## 2018-01-19 MED ORDER — ONDANSETRON HCL 4 MG/2ML IJ SOLN: 4.0000 mg | INTRAMUSCULAR | Status: DC | PRN

## 2018-01-19 MED ORDER — LACTATED RINGERS IV SOLN: 500.0000 mL | Freq: Once | INTRAVENOUS | Status: DC

## 2018-01-19 MED ORDER — SIMETHICONE 80 MG PO CHEW: 80.0000 mg | CHEWABLE_TABLET | ORAL | Status: DC | PRN

## 2018-01-19 MED ORDER — PRENATAL MULTIVITAMIN CH
1.0000 | ORAL_TABLET | Freq: Every day | ORAL | Status: DC
  Administered 2018-01-20 – 2018-01-21 (×2): 1 via ORAL
  Filled 2018-01-19 (×2): qty 1

## 2018-01-19 MED ORDER — WITCH HAZEL-GLYCERIN EX PADS: 1.0000 "application " | MEDICATED_PAD | CUTANEOUS | Status: DC | PRN

## 2018-01-19 MED ORDER — LIDOCAINE HCL (PF) 1 % IJ SOLN
INTRAMUSCULAR | Status: DC | PRN
  Administered 2018-01-19: 5 mL via EPIDURAL

## 2018-01-19 MED ORDER — LACTATED RINGERS IV SOLN
500.0000 mL | INTRAVENOUS | Status: DC | PRN
  Administered 2018-01-19: 500 mL via INTRAVENOUS

## 2018-01-19 MED ORDER — BETAMETHASONE SOD PHOS & ACET 6 (3-3) MG/ML IJ SUSP
12.0000 mg | INTRAMUSCULAR | Status: DC
  Administered 2018-01-19: 12 mg via INTRAMUSCULAR
  Filled 2018-01-19: qty 2

## 2018-01-19 MED ORDER — LACTATED RINGERS IV SOLN
INTRAVENOUS | Status: DC
  Administered 2018-01-19: 13:00:00 via INTRAVENOUS

## 2018-01-19 MED ORDER — DIPHENHYDRAMINE HCL 25 MG PO CAPS: 25.0000 mg | ORAL_CAPSULE | Freq: Four times a day (QID) | ORAL | Status: DC | PRN

## 2018-01-19 MED ORDER — IBUPROFEN 600 MG PO TABS
600.0000 mg | ORAL_TABLET | Freq: Four times a day (QID) | ORAL | Status: DC
  Administered 2018-01-19 – 2018-01-21 (×8): 600 mg via ORAL
  Filled 2018-01-19 (×8): qty 1

## 2018-01-19 MED ORDER — COCONUT OIL OIL: 1.0000 "application " | TOPICAL_OIL | Status: DC | PRN

## 2018-01-19 NOTE — Anesthesia Postprocedure Evaluation (Signed)
Anesthesia Post Note  Patient: Adult nurse  Procedure(s) Performed: AN AD [place]     Patient location during evaluation: Mother Baby Anesthesia Type: Epidural Level of consciousness: awake Pain management: satisfactory to patient Vital Signs Assessment: post-procedure vital signs reviewed and stable Respiratory status: spontaneous breathing Cardiovascular status: stable Anesthetic complications: no    Last Vitals:  Vitals:   01/19/18 1740 01/19/18 1930  BP: 132/82 125/81  Pulse: 88 74  Resp: 18 18  Temp: (!) 36.4 C 37 C  SpO2: 100%     Last Pain:  Vitals:   01/19/18 1930  TempSrc: Oral  PainSc: 0-No pain   Pain Goal:                 [employer]

## 2018-01-19 NOTE — H&P (Addendum)
LABOR AND DELIVERY ADMISSION HISTORY AND PHYSICAL NOTE  Regina Newman is a 31 y.o. female [phone] with IUP at [redacted]w[redacted]d by 51 week [place] presenting for PPROM. Pregnancy is complicated by no prenatal care this pregnancy, history of IUFD at 23 weeks, tobacco abuse, marijuana use in pregnancy, child with [place]. Unknown LMP, but states that she had positive HPT in June 2019. Presented to clinic in November with positive pregnancy test, but no follow-up appointments after. Had one [place] on 12/10/17 which showed [redacted] week GA with grossly normal anatomy and 8/8 BPP. Noted leakage of fluid around 10 am this morning, and shortly after started having painful contractions. Contractions are now coming more frequently, and becoming more painful. Normal fetal movement.   Prenatal History/Complications: PNC at [facility] Pregnancy complications:  - No prenatal care - History of IUFD at 23 weeks likely 2/2 placental abruption - Marijuana use in pregnancy - Tobacco abuse - Child with [place]  Past Medical History: Past Medical History:  Diagnosis Date  . Anemia   . Trichomonas     Past Surgical History: Past Surgical History:  Procedure Laterality Date  . NO PAST SURGERIES    . WISDOM TOOTH EXTRACTION      Obstetrical History: OB History    Gravida  4   Para  3   Term  2   Preterm  1   AB      Living  2     SAB      TAB      Ectopic      Multiple      Live Births  1           Social History: Social History   Socioeconomic History  . Marital status: Single    Spouse name: Not on file  . Number of children: Not on file  . Years of education: Not on file  . Highest education level: Not on file  Occupational History  . Not on file  Social Needs  . Financial resource strain: Not on file  . Food insecurity:    Worry: Not on file    Inability: Not on file  . Transportation needs:    Medical: Not on file    Non-medical: Not on file  Tobacco Use  . Smoking status: Current Every Day  Smoker    Packs/day: 0.50    Years: 10.00    Pack years: 5.00    Types: Cigarettes  . Smokeless tobacco: Never Used  Substance and Sexual Activity  . Alcohol use: No    Comment: few days a week  . Drug use: Yes    Types: Marijuana    Comment: recent use- 2014  . Sexual activity: Yes  Lifestyle  . Physical activity:    Days per week: Not on file    Minutes per session: Not on file  . Stress: Not on file  Relationships  . Social connections:    Talks on phone: Not on file    Gets together: Not on file    Attends religious service: Not on file    Active member of club or organization: Not on file    Attends meetings of clubs or organizations: Not on file    Relationship status: Not on file  Other Topics Concern  . Not on file  Social History Narrative  . Not on file    Family History: History reviewed. No pertinent family history.  Allergies: No Known Allergies  Medications Prior to Admission  Medication Sig Dispense Refill Last Dose  . HYDROcodone-acetaminophen (NORCO/VICODIN) 5-325 MG per tablet Take 1-2 tablets by mouth every 6 (six) hours as needed for moderate pain or severe pain. 13 tablet 0   . naproxen (NAPROSYN) 500 MG tablet Take 1 tablet (500 mg total) by mouth 2 (two) times daily. 30 tablet 0   . Prenatal MV-Min-FA-Omega-3 (PRENATAL GUMMIES/DHA & FA) 0.4-32.5 MG CHEW Chew 1 tablet by mouth daily. 30 tablet 11 Taking     Review of Systems  All systems reviewed and negative except as stated in HPI  Physical Exam Blood pressure 127/85, pulse (!) 119, temperature 97.9 F (36.6 C), temperature source Oral, resp. rate 16, height 5\' 8"  (1.727 m), weight 59 kg, last menstrual period 07/10/2017, SpO2 100 %, unknown if currently breastfeeding. General appearance: alert, oriented, NAD but uncomfortable with contractions Lungs: normal respiratory effort Heart: tachycardic Abdomen: soft, non-tender; gravid, FH appropriate for GA Extremities: No calf swelling or  tenderness Presentation: cephalic by sutures Fetal monitoring: baseline HR 130, mod variability, +accel, early decel Uterine activity: q3-5 min Dilation: (SVD of viable female ) Effacement (%): 100 Station: Plus 2 Exam by:: erin hampton RNC  Prenatal labs: ABO, Rh: --/--/O POS (12/30 1241) Antibody: NEG (12/30 1241) Rubella:  pending RPR:   pending HBsAg:   pending HIV:   pending GC/Chlamydia: pending GBS:   unknown 2-hr GTT: unknown Genetic screening: unknown Anatomy US: normal anatomy on 32 week [place]  Prenatal Transfer Tool  Maternal Diabetes:  unknown Genetic Screening: not done Maternal Ultrasounds/Referrals: Normal Fetal Ultrasounds or other Referrals:  None Maternal Substance Abuse:  Yes:  Type: Smoker, Marijuana Significant Maternal Medications:  None Significant Maternal Lab Results: None  Results for orders placed or performed during the hospital encounter of 01/19/18 (from the past 24 hour(s))  CBC   Collection Time: 01/19/18 11:33 AM  Result Value Ref Range   WBC 11.3 (H) 4.0 - 10.5 K/uL   RBC 4.04 3.87 - 5.11 MIL/uL   Hemoglobin 9.9 (L) 12.0 - 15.0 g/dL   HCT 32.8 (L) 36.0 - 46.0 %   MCV 81.2 80.0 - 100.0 fL   MCH 24.5 (L) 26.0 - 34.0 pg   MCHC 30.2 30.0 - 36.0 g/dL   RDW 17.1 (H) 11.5 - 15.5 %   Platelets 279 150 - 400 K/uL   nRBC 1.3 (H) 0.0 - 0.2 %  Comprehensive metabolic panel   Collection Time: 01/19/18 11:33 AM  Result Value Ref Range   Sodium 136 135 - 145 mmol/L   Potassium 3.5 3.5 - 5.1 mmol/L   Chloride 102 98 - 111 mmol/L   CO2 21 (L) 22 - 32 mmol/L   Glucose, Bld 85 70 - 99 mg/dL   BUN <5 (L) 6 - 20 mg/dL   Creatinine, Ser 0.76 0.44 - 1.00 mg/dL   Calcium 9.1 8.9 - 10.3 mg/dL   Total Protein 7.8 6.5 - 8.1 g/dL   Albumin 3.0 (L) 3.5 - 5.0 g/dL   AST 26 15 - 41 U/L   ALT 16 0 - 44 U/L   Alkaline Phosphatase 206 (H) 38 - 126 U/L   Total Bilirubin 0.5 0.3 - 1.2 mg/dL   GFR calc non Af Amer >60 >60 mL/min   GFR calc Af Amer >60 >60  mL/min   Anion gap 13 5 - 15  Type and screen [place]   Collection Time: 01/19/18 11:33 AM  Result Value Ref Range   ABO/RH(D) O POS  Antibody Screen NEG    Sample Expiration      01/22/2018 Performed at [facility], [place] [address] [place] [address]   CBC   Collection Time: 01/19/18 12:41 PM  Result Value Ref Range   WBC 9.7 4.0 - 10.5 K/uL   RBC 3.45 (L) 3.87 - 5.11 MIL/uL   Hemoglobin 8.7 (L) 12.0 - 15.0 g/dL   HCT 27.5 (L) 36.0 - 46.0 %   MCV 79.7 (L) 80.0 - 100.0 fL   MCH 25.2 (L) 26.0 - 34.0 pg   MCHC 31.6 30.0 - 36.0 g/dL   RDW 17.0 (H) 11.5 - 15.5 %   Platelets 243 150 - 400 K/uL   nRBC 1.2 (H) 0.0 - 0.2 %  Type and screen [place]   Collection Time: 01/19/18 12:41 PM  Result Value Ref Range   ABO/RH(D) O POS    Antibody Screen NEG    Sample Expiration      01/22/2018 Performed at [facility], [address], [place], [place] [address]     Patient Active Problem List   Diagnosis Date Noted  . Preterm premature rupture of membranes 01/19/2018  . Tobacco abuse 01/19/2018  . Poor compliance with prenatal care 03/20/2012  . Prior pregnancy with fetal demise and current pregnancy 02/17/2012  . Prior pregnancy with placenta abruption, antepartum 02/17/2012  . H/O postpartum hemorrhage, currently pregnant 02/17/2012  . Previous child with [place]- Dx @ age 59 02/17/2012    Assessment: Regina Newman is a 32 y.o. [id number] at [redacted]w[redacted]d here for PPROM.  #Labor: SOL after PPROM. Will give betamethasone for prematurity #Pain: Requesting epidural #FWB: Cat I  EFW 5-6lbs, will have NICU present given lower than expected fundal height  #ID: GBS unknown, ampicillin ordered for prematurity #MOF: bottle #MOC: Depo #Circ:  NA  Lebron Quam, MD Family Medicine PGY3 01/19/2018, 2:27 PM  Attestation: I have seen this patient and agree with the resident's documentation. I have examined them separately, and we  have discussed the plan of care.  Lambert Mody. Juleen China, DO OB/GYN Fellow

## 2018-01-19 NOTE — ED Provider Notes (Signed)
[facility] Emergency Department Provider Note MRN:  [id number]  Arrival date & time: 01/19/18     Chief Complaint   Laboring   History of Present Illness   Regina Newman is a 32 y.o. year-old female with no significant past medical history presenting to the ED with chief complaint of laboring.  Patient's water broke 20 to 30 minutes prior to arrival.  Patient is unsure how far along she is in this current pregnancy.  Has had 4 children prior to this pregnancy.  States that her deliveries are usually very fast.  Patient is unsure of her last menstrual period, either in April or June.  Patient denies vaginal bleeding, having contractions every 2 to 5 minutes since her water broke.  Review of Systems  A complete 10 system review of systems was obtained and all systems are negative except as noted in the HPI and PMH.   Patient's Health History    Past Medical History:  Diagnosis Date  . Anemia   . Trichomonas     Past Surgical History:  Procedure Laterality Date  . NO PAST SURGERIES    . WISDOM TOOTH EXTRACTION      History reviewed. No pertinent family history.  Social History   Socioeconomic History  . Marital status: Single    Spouse name: Not on file  . Number of children: Not on file  . Years of education: Not on file  . Highest education level: Not on file  Occupational History  . Not on file  Social Needs  . Financial resource strain: Not on file  . Food insecurity:    Worry: Not on file    Inability: Not on file  . Transportation needs:    Medical: Not on file    Non-medical: Not on file  Tobacco Use  . Smoking status: Current Every Day Smoker    Packs/day: 0.50    Years: 10.00    Pack years: 5.00    Types: Cigarettes  . Smokeless tobacco: Never Used  Substance and Sexual Activity  . Alcohol use: No    Comment: few days a week  . Drug use: Yes    Types: Marijuana    Comment: recent use- 2014  . Sexual activity: Yes  Lifestyle  .  Physical activity:    Days per week: Not on file    Minutes per session: Not on file  . Stress: Not on file  Relationships  . Social connections:    Talks on phone: Not on file    Gets together: Not on file    Attends religious service: Not on file    Active member of club or organization: Not on file    Attends meetings of clubs or organizations: Not on file    Relationship status: Not on file  . Intimate partner violence:    Fear of current or ex partner: Not on file    Emotionally abused: Not on file    Physically abused: Not on file    Forced sexual activity: Not on file  Other Topics Concern  . Not on file  Social History Narrative  . Not on file     Physical Exam  Vital Signs and Nursing Notes reviewed Vitals:   01/19/18 1130 01/19/18 1133  BP: (!) 121/91 106/70  Pulse: (!) 109 (!) 117  Resp: 15 16  Temp:  98 F (36.7 C)  SpO2: 97% 100%    CONSTITUTIONAL: Well-appearing, NAD NEURO:  Alert and oriented  x 3, no focal deficits EYES:  eyes equal and reactive ENT/NECK:  no LAD, no JVD CARDIO: Tachycardic rate, well-perfused, normal S1 and S2 PULM:  CTAB no wheezing or rhonchi GI/GU: Gravid, nontender; baby's head is easily palpable during pelvic exam, estimated 5 cm dilated, near full effacement MSK/SPINE:  No gross deformities, no edema SKIN:  no rash, atraumatic PSYCH:  Appropriate speech and behavior  Diagnostic and Interventional Summary    Labs Reviewed  CBC  COMPREHENSIVE METABOLIC PANEL  TYPE AND SCREEN    No orders to display    Medications  lactated ringers bolus 1,000 mL (1,000 mLs Intravenous New Bag/Given 01/19/18 1146)     Procedures Critical Care Critical Care Documentation Critical care time provided by me (excluding procedures): 33 minutes  Condition necessitating critical care: Active labor  Components of critical care management: reviewing of prior records, laboratory and imaging interpretation, frequent re-examination and  reassessment of vital signs, discussion with consulting services, facilitation of transfer    ED Course and Medical Decision Making  I have reviewed the triage vital signs and the nursing notes.  Pertinent labs & imaging results that were available during my care of the patient were reviewed by me and considered in my medical decision making (see below for details).  Patient arriving in active labor, initially thought to be pretty far along.  No prenatal care, unknown gestational age, estimated to be anywhere between 67 and 37 weeks according to mom's last menstrual period.  No bleeding, mom is hemodynamically stable, normal heart rate of the baby on toco.  Appreciative of OB is rapid arrival, repeat cervical exam reveals that she is forcing meters dilated, 70% effaced, appropriate for prompt transfer to [facility] center for delivery.  Dr. Geralyn Flash accepting physician.   [name], [place] [email or web address]  Final Clinical Impressions(s) / ED Diagnoses     ICD-10-CM   1. Labor without complication M57     ED Discharge Orders    None         Maudie Flakes, MD 01/19/18 1149

## 2018-01-19 NOTE — Anesthesia Preprocedure Evaluation (Signed)
Anesthesia Evaluation  Patient identified by MRN, date of birth, ID band Patient awake    Reviewed: Allergy & Precautions, H&P , NPO status , Patient's Chart, lab work & pertinent test results  Airway Mallampati: II  TM Distance: >3 FB Neck ROM: Full    Dental no notable dental hx.    Pulmonary Current Smoker,    Pulmonary exam normal breath sounds clear to auscultation       Cardiovascular negative cardio ROS Normal cardiovascular exam Rhythm:Regular Rate:Normal     Neuro/Psych negative neurological ROS     GI/Hepatic Neg liver ROS,   Endo/Other  negative endocrine ROS  Renal/GU negative Renal ROS     Musculoskeletal   Abdominal   Peds  Hematology  (+) Blood dyscrasia, anemia ,   Anesthesia Other Findings   Reproductive/Obstetrics (+) Pregnancy                             Lab Results  Component Value Date   WBC 9.7 01/19/2018   HGB 8.7 (L) 01/19/2018   HCT 27.5 (L) 01/19/2018   MCV 79.7 (L) 01/19/2018   PLT 243 01/19/2018    Anesthesia Physical Anesthesia Plan  ASA: III  Anesthesia Plan: Epidural   Post-op Pain Management:    Induction:   PONV Risk Score and Plan:   Airway Management Planned:   Additional Equipment:   Intra-op Plan:   Post-operative Plan:   Informed Consent: I have reviewed the patients History and Physical, chart, labs and discussed the procedure including the risks, benefits and alternatives for the proposed anesthesia with the patient or authorized representative who has indicated his/her understanding and acceptance.     Plan Discussed with:   Anesthesia Plan Comments:         Anesthesia Quick Evaluation

## 2018-01-19 NOTE — ED Notes (Signed)
Rapid OB at bedside 

## 2018-01-19 NOTE — ED Notes (Signed)
[facility] notified and sending truck.

## 2018-01-19 NOTE — ED Triage Notes (Signed)
Pt arrives to ED stating "my water broke, my water broke." Pt holding vaginal area. Pt escorted immediately to Tra C, pt undressed, placed on stretcher. Denies any known complications with this pregnancy, minimal prenatal care per pt.

## 2018-01-19 NOTE — Discharge Summary (Addendum)
Postpartum Discharge Summary     Patient Name: Regina Newman DOB: 1985-06-04 MRN: [id number]  Date of admission: 01/19/2018 Delivering Provider: Glenice Bow   Date of discharge: 01/21/2018  Admitting diagnosis: Labor without complication [R42] Intrauterine pregnancy: [redacted]w[redacted]d     Secondary diagnosis:  Active Problems:   Prior pregnancy with fetal demise and current pregnancy   Prior pregnancy with placenta abruption, antepartum   H/O postpartum hemorrhage, currently pregnant   Poor compliance with prenatal care   Preterm premature rupture of membranes   Tobacco abuse  Additional problems: PPROM     Discharge diagnosis: Preterm pregnancy delivered                                                                                                Post partum procedures:none  Augmentation: none  Complications: None  Hospital course:  Onset of Labor With Vaginal Delivery     32 y.o. yo [phone] at [redacted]w[redacted]d was admitted in Active Labor on 01/19/2018 after presentation to outside hospital with PPROM. Received epidural. Patient had an uncomplicated labor course as follows:  Membrane Rupture Time/Date: 10:12 AM ,01/19/2018   Intrapartum Procedures: Episiotomy: None [1]                                         Lacerations:  Periurethral [8]  Patient had a delivery of a Viable infant. 01/19/2018  Information for the patient's newborn:  Kerissa, Coia [[id number]]    Patient had an uncomplicated postpartum course.  She is ambulating, tolerating a regular diet, passing flatus, and urinating well. Patient is discharged home in stable condition on 01/21/18, pending SW consult for no prenatal care.  GBS Unknown: Given 1 dose of ampicillin BMZ received: Yes - 1 dose administered prior to delivery   Physical exam  Vitals:   01/20/18 0540 01/20/18 1416 01/20/18 2108 01/21/18 0615  BP: 128/74 127/81 112/69 110/67  Pulse: 72 79 99 89  Resp: 18 18 20 18   Temp: 97.7 F (36.5 C) 97.9 F  (36.6 C) 97.8 F (36.6 C) 98.7 F (37.1 C)  TempSrc: Oral  Oral Oral  SpO2:      Weight:      Height:       General: alert and cooperative Lochia: appropriate Uterine Fundus: firm Incision: N/A DVT Evaluation: No evidence of DVT seen on physical exam. Labs: Lab Results  Component Value Date   WBC 9.7 01/19/2018   HGB 8.7 (L) 01/19/2018   HCT 27.5 (L) 01/19/2018   MCV 79.7 (L) 01/19/2018   PLT 243 01/19/2018   CMP Latest Ref Rng & Units 01/19/2018  Glucose 70 - 99 mg/dL 85  BUN 6 - 20 mg/dL <5(L)  Creatinine 0.44 - 1.00 mg/dL 0.76  Sodium 135 - 145 mmol/L 136  Potassium 3.5 - 5.1 mmol/L 3.5  Chloride 98 - 111 mmol/L 102  CO2 22 - 32 mmol/L 21(L)  Calcium 8.9 - 10.3 mg/dL 9.1  Total Protein 6.5 - 8.1 g/dL 7.8  Total Bilirubin 0.3 - 1.2 mg/dL 0.5  Alkaline Phos 38 - 126 U/L 206(H)  AST 15 - 41 U/L 26  ALT 0 - 44 U/L 16    Discharge instruction: per After Visit Summary and "Baby and Me Booklet".  After visit meds:  Allergies as of 01/21/2018   No Known Allergies     Medication List    TAKE these medications   ibuprofen 600 MG tablet Commonly known as:  ADVIL,MOTRIN Take 1 tablet (600 mg total) by mouth every 6 (six) hours as needed.   PRENATAL GUMMIES/DHA & FA 0.4-32.5 MG Chew Chew 1 tablet by mouth daily.      Diet: routine diet  Activity: Advance as tolerated. Pelvic rest for 6 weeks.   Outpatient follow NI:DPOE message for follow-up in 4-6 weeks Follow up Appt: Future Appointments  Date Time Provider [place]  02/10/2018  3:00 PM [place]  02/10/2018  5:20 PM Glenice Bow, DO [facility]   Follow up Visit:   Please schedule this patient for Postpartum visit in: 4 weeks with the following provider: Any provider High risk pregnancy complicated by: no prenatal care Delivery mode:  SVD Anticipated Birth Control:  Depo PP Procedures needed: none  Schedule Integrated BH visit: yes (no PNC) Newborn  Data: Live born female  Birth Weight:   APGAR: 39, 9  Newborn Delivery   Birth date/time:  01/19/2018 14:03:00 Delivery type:  Vaginal, Spontaneous    Baby Feeding: Bottle Disposition:home with mother, pending Peds d/c and SW consult   01/21/2018 Myrtis Ser, CNM  9:46 AM

## 2018-01-19 NOTE — Progress Notes (Signed)
1136 Arrived to evaluate this 32 yo G4P2 @ 36.[redacted] wks GA based on MFM [place] on November 20.  SVE 4/70/-2, ROM light meconium fluid noted.  FHR Category II with UC's Q2-3 min.  [facility] called for transfer. Dr. Rip Harbour of [facility] will accept transfer.

## 2018-01-19 NOTE — Lactation Note (Signed)
This note was copied from a baby's chart. Lactation Consultation Note  Patient Name: Girl Breianna Delfino UEKCM'[id number] Date: 01/19/2018  Baby girl 36 hours old born at [redacted]w[redacted]d gestation. Mom is G4P3.  Mom reports Breastfed with her first baby until it started hurting.  Then pumped and put breastmilk in bottles.  Mom reports she cant remember for how long.  Mom reports second child was exclusively formula feed.  Mom reports she had planned to breastfeed and formula feed but knows she will have a positive marijuana test so does not want to pass that on to her baby. Mom reports she does not know what she will do.  Reports right now she just plans to formula feed until she knows shes clean  Urged mom to call lactation as needed.    Maternal Data    Feeding    LATCH Score                   Interventions    Lactation Tools Discussed/Used     Consult Status      Shonna Deiter Thompson Caul 01/19/2018, 11:31 PM

## 2018-01-19 NOTE — Anesthesia Procedure Notes (Signed)
Epidural Patient location during procedure: OB Start time: 01/19/2018 1:09 PM End time: 01/19/2018 1:26 PM  Staffing Anesthesiologist: Barnet Glasgow, MD Performed: anesthesiologist   Preanesthetic Checklist Completed: patient identified, site marked, surgical consent, pre-op evaluation, timeout performed, IV checked, risks and benefits discussed and monitors and equipment checked  Epidural Patient position: sitting Prep: site prepped and draped and DuraPrep Patient monitoring: continuous pulse ox and blood pressure Approach: midline Location: L3-L4 Injection technique: LOR air  Needle:  Needle type: Tuohy  Needle gauge: 17 G Needle length: 9 cm and 9 Needle insertion depth: 6 cm Catheter type: closed end flexible Catheter size: 19 Gauge Catheter at skin depth: 11 cm Test dose: negative  Assessment Events: blood not aspirated, injection not painful, no injection resistance, negative IV test and no paresthesia  Additional Notes Patient identified. Risks/Benefits/Options discussed with patient including but not limited to bleeding, infection, nerve damage, paralysis, failed block, incomplete pain control, headache, blood pressure changes, nausea, vomiting, reactions to medication both or allergic, itching and postpartum back pain. Confirmed with bedside nurse the patient's most recent platelet count. Confirmed with patient that they are not currently taking any anticoagulation, have any bleeding history or any family history of bleeding disorders. Patient expressed understanding and wished to proceed. All questions were answered. Sterile technique was used throughout the entire procedure. Please see nursing notes for vital signs. Test dose was given through epidural needle and negative prior to continuing to dose epidural or start infusion. Warning signs of high block given to the patient including shortness of breath, tingling/numbness in hands, complete motor block, or any  concerning symptoms with instructions to call for help. Patient was given instructions on fall risk and not to get out of bed. All questions and concerns addressed with instructions to call with any issues.1  Attempt (S) . Patient tolerated procedure well.

## 2018-01-20 LAB — HIV ANTIBODY (ROUTINE TESTING W REFLEX): HIV Screen 4th Generation wRfx: NONREACTIVE

## 2018-01-20 LAB — RUBELLA SCREEN: Rubella: 1.1 index (ref >0.99)

## 2018-01-20 LAB — RPR: RPR Ser Ql: NONREACTIVE

## 2018-01-20 MED ORDER — PNEUMOCOCCAL VAC POLYVALENT 25 MCG/0.5ML IJ INJ: 0.5000 mL | INJECTION | INTRAMUSCULAR | Status: DC

## 2018-01-20 MED ORDER — PNEUMOCOCCAL VAC POLYVALENT 25 MCG/0.5ML IJ INJ
0.5000 mL | INJECTION | Freq: Once | INTRAMUSCULAR | Status: AC
  Administered 2018-01-21: 0.5 mL via INTRAMUSCULAR
  Filled 2018-01-20: qty 0.5

## 2018-01-20 MED ORDER — INFLUENZA VAC SPLIT QUAD 0.5 ML IM SUSY
0.5000 mL | PREFILLED_SYRINGE | Freq: Once | INTRAMUSCULAR | Status: AC
  Administered 2018-01-20: 0.5 mL via INTRAMUSCULAR

## 2018-01-20 NOTE — Progress Notes (Signed)
Post Partum Day 1 Subjective: no complaints, up ad lib and tolerating PO  Objective: Blood pressure 128/74, pulse 72, temperature 97.7 F (36.5 C), temperature source Oral, resp. rate 18, height 5\' 8"  (1.727 m), weight 59 kg, last menstrual period 07/10/2017, SpO2 95 %, unknown if currently breastfeeding.  Physical Exam:  General: alert, cooperative and no distress Lochia: appropriate Uterine Fundus: firm Incision: n/a DVT Evaluation: No evidence of DVT seen on physical exam. Negative Homan's sign. No cords or calf tenderness. No significant calf/ankle edema.  Recent Labs    01/19/18 1133 01/19/18 1241  HGB 9.9* 8.7*  HCT 32.8* 27.5*    Assessment/Plan: Plan for discharge tomorrow and Breastfeeding   LOS: 1 day   Truett Mainland 01/20/2018, 10:19 AM

## 2018-01-21 MED ORDER — IBUPROFEN 600 MG PO TABS: 600.0000 mg | ORAL_TABLET | Freq: Four times a day (QID) | ORAL | 0 refills | Status: DC | PRN

## 2018-01-21 NOTE — Clinical Social Work Maternal (Signed)
CLINICAL SOCIAL WORK MATERNAL/CHILD NOTE  Patient Details  Name: Regina Newman MRN: [id number] Date of Birth: 12/09/1985  Date:  01/21/2018  Clinical Social Worker Initiating Note:  Angel Boyd-Gilyard Date/Time: Initiated:  01/21/18/1134     Child's Name:  Regina Newman   Biological Parents:  Mother(MOB declined to provide any information about FOB. )   Need for Interpreter:  None   Reason for Referral:  Late or No Prenatal Care , Current Substance Use/Substance Use During Pregnancy (No PNC and hx of marijuana use. Infant UDS positive for THC. )   Address:  [address] [place] [address]    Phone number:  [phone] MOB reported this phone number is used for texting only.    Additional phone number:  Household Members/Support Persons (HM/SP):   Household Member/Support Person 1, Household Member/Support Person 2, Household Member/Support Person 3(Per MOB, MOB lives in a boarding house with 3 other roomates. MOB also reported that MOB has 2 older children that are currenlty in CPS custody. )    HM/SP Name Relationship DOB or Age  HM/SP -1 Regina Newman  son 02/20/2009  HM/SP -2 Regina Newman daughter 03/24/2013  HM/SP -3        HM/SP -4        HM/SP -5        HM/SP -6        HM/SP -7        HM/SP -8          Natural Supports (not living in the home):  Spouse/significant other, Immediate Family, Friends(MOB reported that MOB's family is supportive however they reside in [place], [place].  MOB also shared that MOB's friend Ted White is a support. )   Professional Supports: None   Employment: Unemployed   Type of Work:     Education:  High school graduate   Homebound arranged:    Financial Resources:  Medicaid   Other Resources:  Food Stamps (CSW will request that WIC complete application with MOB while she is inpatient; if not MOB has contact information for WIC and will call and make an appointment. )   Cultural/Religious Considerations Which May  Impact Care: none reported  Strengths:  Ability to meet basic needs , Pediatrician chosen, Home prepared for child    Psychotropic Medications:         Pediatrician:    [place]  Pediatrician List:   [facility] ([address])  [place]    [place]    [place]    [place]    [place]      Pediatrician Fax Number:    Risk Factors/Current Problems:  Substance Use , DHHS Involvement (MOB has 2 older children in foster care/possibly adopted. )   Cognitive State:  Able to Concentrate , Alert , Linear Thinking    Mood/Affect:  Comfortable , Apprehensive , Flat    CSW Assessment: CSW met with MOB in room 119 to complete an assessment for SA hx and CPS hx.  When CSW arrived, MOB was resting in bed and infant was asleep in bassinet.  MOB appeared flat and was initially apprehensive about meeting with CSW.  However, after CSW explained CSW's role MOB was more open and her mood heighten.   CSW asked about MOB's CPS hx and MOB communicated that MOB's older 2 children are currently in foster care. MOB was not a good historian and was unable to communicate the year CPS became   involved or the reason.  MOB also struggled with remembering her older 2 children dates of birth. MOB acknowledged not being in contact with her CPS worker and also reported being unaware of the status of her CPS case.   CSW also asked about MOB's substance use and MOB acknowledged the use of marijuana throughout her pregnancy.  MOB stated, "I had to smoke weed in order to have an appetite." MOB denied the use of all other illicit substance. CSW explained the hospital's infant substance exposure policy and MOB was understanding.  CSW made MOB aware that infant's UDS was positive for THC and CSW will be making a report to [place] CPS.   MOB reported having all essential items or infant and expressed feeling prepared to parent.   A  CPS report was made to [place] intake worker Casey Owens.  CPS accepted the reported and communicated that CPS will make contact with MOB within 24 hours.  At this time there are barriers to infant's discharge to MOB until CPS can provide CSW with a safety disposition plan.   CSW Plan/Description:  Sudden Infant Death Syndrome (SIDS) Education, Other Information/Referral to Community Resources, CSW Awaiting CPS Disposition Plan, Perinatal Mood and Anxiety Disorder (PMADs) Education, Hospital Drug Screen Policy Information, Child Protective Service Report , CSW Will Continue to Monitor Umbilical Cord Tissue Drug Screen Results and Make Report if Warranted   Angel Boyd-Gilyard, MSW, LCSW Clinical Social Work [phone]   ANGEL D BOYD-GILYARD, LCSW 01/21/2018, 1:05 PM  

## 2018-01-21 NOTE — Discharge Instructions (Signed)
Vaginal Delivery, Care After °Refer to this sheet in the next few weeks. These instructions provide you with information about caring for yourself after vaginal delivery. Your health care provider may also give you more specific instructions. Your treatment has been planned according to current medical practices, but problems sometimes occur. Call your health care provider if you have any problems or questions. °What can I expect after the procedure? °After vaginal delivery, it is common to have: °· Some bleeding from your vagina. °· Soreness in your abdomen, your vagina, and the area of skin between your vaginal opening and your anus (perineum). °· Pelvic cramps. °· Fatigue. °Follow these instructions at home: °Medicines °· Take over-the-counter and prescription medicines only as told by your health care provider. °· If you were prescribed an antibiotic medicine, take it as told by your health care provider. Do not stop taking the antibiotic until it is finished. °Driving ° °· Do not drive or operate heavy machinery while taking prescription pain medicine. °· Do not drive for 24 hours if you received a sedative. °Lifestyle °· Do not drink alcohol. This is especially important if you are breastfeeding or taking medicine to relieve pain. °· Do not use tobacco products, including cigarettes, chewing tobacco, or e-cigarettes. If you need help quitting, ask your health care provider. °Eating and drinking °· Drink at least 8 eight-ounce glasses of water every day unless you are told not to by your health care provider. If you choose to breastfeed your baby, you may need to drink more water than this. °· Eat high-fiber foods every day. These foods may help prevent or relieve constipation. High-fiber foods include: °? Whole grain cereals and breads. °? Brown rice. °? Beans. °? Fresh fruits and vegetables. °Activity °· Return to your normal activities as told by your health care provider. Ask your health care provider what  activities are safe for you. °· Rest as much as possible. Try to rest or take a nap when your baby is sleeping. °· Do not lift anything that is heavier than your baby or 10 lb (4.5 kg) until your health care provider says that it is safe. °· Talk with your health care provider about when you can engage in sexual activity. This may depend on your: °? Risk of infection. °? Rate of healing. °? Comfort and desire to engage in sexual activity. °Vaginal Care °· If you have an episiotomy or a vaginal tear, check the area every day for signs of infection. Check for: °? More redness, swelling, or pain. °? More fluid or blood. °? Warmth. °? Pus or a bad smell. °· Do not use tampons or douches until your health care provider says this is safe. °· Watch for any blood clots that may pass from your vagina. These may look like clumps of dark red, brown, or black discharge. °General instructions °· Keep your perineum clean and dry as told by your health care provider. °· Wear loose, comfortable clothing. °· Wipe from front to back when you use the toilet. °· Ask your health care provider if you can shower or take a bath. If you had an episiotomy or a perineal tear during labor and delivery, your health care provider may tell you not to take baths for a certain length of time. °· Wear a bra that supports your breasts and fits you well. °· If possible, have someone help you with household activities and help care for your baby for at least a few days after you   leave the hospital. °· Keep all follow-up visits for you and your baby as told by your health care provider. This is important. °Contact a health care provider if: °· You have: °? Vaginal discharge that has a bad smell. °? Difficulty urinating. °? Pain when urinating. °? A sudden increase or decrease in the frequency of your bowel movements. °? More redness, swelling, or pain around your episiotomy or vaginal tear. °? More fluid or blood coming from your episiotomy or vaginal  tear. °? Pus or a bad smell coming from your episiotomy or vaginal tear. °? A fever. °? A rash. °? Little or no interest in activities you used to enjoy. °? Questions about caring for yourself or your baby. °· Your episiotomy or vaginal tear feels warm to the touch. °· Your episiotomy or vaginal tear is separating or does not appear to be healing. °· Your breasts are painful, hard, or turn red. °· You feel unusually sad or worried. °· You feel nauseous or you vomit. °· You pass large blood clots from your vagina. If you pass a blood clot from your vagina, save it to show to your health care provider. Do not flush blood clots down the toilet without having your health care provider look at them. °· You urinate more than usual. °· You are dizzy or light-headed. °· You have not breastfed at all and you have not had a menstrual period for 12 weeks after delivery. °· You have stopped breastfeeding and you have not had a menstrual period for 12 weeks after you stopped breastfeeding. °Get help right away if: °· You have: °? Pain that does not go away or does not get better with medicine. °? Chest pain. °? Difficulty breathing. °? Blurred vision or spots in your vision. °? Thoughts about hurting yourself or your baby. °· You develop pain in your abdomen or in one of your legs. °· You develop a severe headache. °· You faint. °· You bleed from your vagina so much that you fill two sanitary pads in one hour. °This information is not intended to replace advice given to you by your health care provider. Make sure you discuss any questions you have with your health care provider. °Document Released: 01/05/2000 Document Revised: 06/21/2015 Document Reviewed: 01/22/2015 °Elsevier Interactive Patient Education © 2019 Elsevier [employer]. ° °

## 2018-01-21 NOTE — Plan of Care (Signed)
Patient appropriate for discharge.

## 2018-01-23 LAB — GC/CHLAMYDIA PROBE AMP (~~LOC~~) NOT AT ARMC
Chlamydia: NEGATIVE
Neisseria Gonorrhea: POSITIVE — AB

## 2018-01-26 ENCOUNTER — Telehealth: Payer: Self-pay

## 2018-01-26 NOTE — Telephone Encounter (Signed)
-----   Message from Chancy Milroy, MD sent at 01/25/2018  3:30 PM EST ----- Pt needs to be treated for gonorrhea as per protocol. Notify HD and advise to have partner treated as well TOC in 4 weeks. Thanks Legrand Como

## 2018-01-26 NOTE — Telephone Encounter (Signed)
Called pt to give test results, no answer ,so left VM to call back regarding test results.

## 2018-01-27 ENCOUNTER — Telehealth: Payer: Self-pay

## 2018-01-27 NOTE — Telephone Encounter (Signed)
Called pt again to go over test results & Tx. No answer, left VM. Advised will send My Chart message & send Certified letter.

## 2018-01-27 NOTE — Telephone Encounter (Signed)
-----   Message from Chancy Milroy, MD sent at 01/25/2018  3:30 PM EST ----- Pt needs to be treated for gonorrhea as per protocol. Notify HD and advise to have partner treated as well TOC in 4 weeks. Thanks Legrand Como

## 2018-02-02 NOTE — Telephone Encounter (Signed)
Called pt to inform her that she has gonorrhea and needs to come to the office to be treated.  Pt did not pick up on either number provided and after ringing for several minutes, the message, "we're sorry but your call cannot be completed as dialed" was received. Letter sent, STD card completed.

## 2018-02-10 ENCOUNTER — Ambulatory Visit: Payer: Medicaid Other | Admitting: Family Medicine

## 2018-02-10 ENCOUNTER — Institutional Professional Consult (permissible substitution): Payer: Medicaid Other

## 2018-02-16 ENCOUNTER — Ambulatory Visit (INDEPENDENT_AMBULATORY_CARE_PROVIDER_SITE_OTHER): Payer: Medicaid Other | Admitting: *Deleted

## 2018-02-16 VITALS — BP 137/87 | HR 71 | Ht 66.0 in | Wt 139.1 lb

## 2018-02-16 DIAGNOSIS — A549 Gonococcal infection, unspecified: Secondary | ICD-10-CM

## 2018-02-16 MED ORDER — CEFTRIAXONE SODIUM 250 MG IJ SOLR
250.0000 mg | Freq: Once | INTRAMUSCULAR | Status: AC
  Administered 2018-02-16: 250 mg via INTRAMUSCULAR

## 2018-02-16 MED ORDER — AZITHROMYCIN 250 MG PO TABS
1000.0000 mg | ORAL_TABLET | Freq: Once | ORAL | Status: AC
  Administered 2018-02-16: 1000 mg via ORAL

## 2018-02-16 NOTE — Progress Notes (Signed)
Pt presents today for treatment of Gonorrhea.  Pt advised to abstain from sex for 2 weeks. She should also inform partner of infection and abstain from sex with him for 2 weeks after his treatment. Pt DNKA on 1/21 for PP exam and Depo Provera - will reschedule today.  Medications administered as prescribed. Pt tolerated well.

## 2018-02-16 NOTE — Progress Notes (Signed)
I agree with the nurses note and documentation.   Noni Saupe I, NP 02/16/2018 7:00 PM

## 2018-03-02 ENCOUNTER — Encounter: Payer: Self-pay | Admitting: Advanced Practice Midwife

## 2018-03-02 ENCOUNTER — Other Ambulatory Visit (HOSPITAL_COMMUNITY)
Admission: RE | Admit: 2018-03-02 | Discharge: 2018-03-02 | Disposition: A | Discharge status: Home / Self Care | Payer: Medicaid Other | Source: Ambulatory Visit | Attending: Advanced Practice Midwife | Admitting: Advanced Practice Midwife

## 2018-03-02 ENCOUNTER — Ambulatory Visit (INDEPENDENT_AMBULATORY_CARE_PROVIDER_SITE_OTHER): Payer: Medicaid Other | Admitting: Advanced Practice Midwife

## 2018-03-02 DIAGNOSIS — Z1389 Encounter for screening for other disorder: Secondary | ICD-10-CM

## 2018-03-02 DIAGNOSIS — Z30013 Encounter for initial prescription of injectable contraceptive: Secondary | ICD-10-CM | POA: Diagnosis present

## 2018-03-02 LAB — POCT URINALYSIS DIP (DEVICE)
Bilirubin Urine: NEGATIVE
Glucose, UA: NEGATIVE mg/dL
Ketones, ur: NEGATIVE mg/dL
Leukocytes, UA: NEGATIVE
Nitrite: NEGATIVE
Protein, ur: NEGATIVE mg/dL
Specific Gravity, Urine: 1.02 (ref 1.005–1.030)
Urobilinogen, UA: 0.2 mg/dL (ref 0.0–1.0)
pH: 5.5 (ref 5.0–8.0)

## 2018-03-02 MED ORDER — MEDROXYPROGESTERONE ACETATE 150 MG/ML IM SUSP
150.0000 mg | Freq: Once | INTRAMUSCULAR | Status: AC
  Administered 2018-03-02: 150 mg via INTRAMUSCULAR

## 2018-03-02 MED ORDER — AMLODIPINE BESYLATE 5 MG PO TABS: 5.0000 mg | ORAL_TABLET | Freq: Every day | ORAL | 1 refills | Status: DC

## 2018-03-02 NOTE — Progress Notes (Signed)
Subjective:     Regina Newman is a 33 y.o. female who presents for a postpartum visit. She is 6 weeks postpartum following a spontaneous vaginal delivery. I have fully reviewed the prenatal and intrapartum course. The delivery was at 36.5 gestational weeks. Outcome: spontaneous vaginal delivery. Anesthesia: epidural. Postpartum course has been unremarkable. Baby's course has been unremarkable. Baby is feeding by bottle - Enfamil with Iron. Bleeding thin lochia. Bowel function is normal. Bladder function is normal. Patient is not sexually active. Contraception method is Depo-Provera injections. Postpartum depression screening: negative.  The following portions of the patient's history were reviewed and updated as appropriate: allergies, current medications, past family history, past medical history, past social history, past surgical history and problem list.  Review of Systems Pertinent items are noted in HPI.   Objective:    BP (!) 158/93   Pulse 88   Wt 137 lb (62.1 kg)   LMP 03/01/2018 (Exact Date)   BMI 22.11 kg/m    Physical Exam  Constitutional: She is oriented to person, place, and time and well-developed, well-nourished, and in no distress. No distress.  Cardiovascular: Normal rate.  Pulmonary/Chest: Effort normal.  Abdominal: Soft. There is no abdominal tenderness.  Genitourinary:    Genitourinary Comments:  External: no lesion Vagina: small amount of white discharge Cervix: pink, smooth, no CMT Uterus: NSSC Adnexa: NT    Neurological: She is alert and oriented to person, place, and time.  Skin: Skin is warm and dry.  Psychiatric: Affect normal.  Nursing note and vitals reviewed.   Assessment:   1. Postpartum care and examination   2. Encounter for initial prescription of injectable contraceptive     Plan:  FU in 12 weeks for repeat depo Pap collected today Hypertensive today, but no documented HTN in prior visits. R X sent for Norvasc 5mg  QD  Advised  patient to FU with PCP

## 2018-03-02 NOTE — Patient Instructions (Signed)
Primary care follow up  [facility] (will see you even if you do not have sickle cell): [facility]: [facility]: [place] [phone]    Medroxyprogesterone injection [Contraceptive] What is this medicine? MEDROXYPROGESTERONE (me DROX ee proe JES te rone) contraceptive injections prevent pregnancy. They provide effective birth control for 3 months. Depo-subQ Provera 104 is also used for treating pain related to endometriosis. This medicine may be used for other purposes; ask your health care provider or pharmacist if you have questions. COMMON BRAND NAME(S): Depo-Provera, Depo-subQ Provera 104 What should I tell my health care provider before I take this medicine? They need to know if you have any of these conditions: -frequently drink alcohol -asthma -blood vessel disease or a history of a blood clot in the lungs or legs -bone disease such as osteoporosis -breast cancer -diabetes -eating disorder (anorexia nervosa or bulimia) -high blood pressure -HIV infection or AIDS -kidney disease -liver disease -mental depression -migraine -seizures (convulsions) -stroke -tobacco smoker -vaginal bleeding -an unusual or allergic reaction to medroxyprogesterone, other hormones, medicines, foods, dyes, or preservatives -pregnant or trying to get pregnant -breast-feeding How should I use this medicine? Depo-Provera Contraceptive injection is given into a muscle. Depo-subQ Provera 104 injection is given under the skin. These injections are given by a health care professional. You must not be pregnant before getting an injection. The injection is usually given during the first 5 days after the start of a menstrual period or 6 weeks after delivery of a baby. Talk to your pediatrician regarding the use of this medicine in children. Special care may be needed. These injections have been used in  female children who have started having menstrual periods. Overdosage: If you think you have taken too much of this medicine contact a poison control center or emergency room at once. NOTE: This medicine is only for you. Do not share this medicine with others. What if I miss a dose? Try not to miss a dose. You must get an injection once every 3 months to maintain birth control. If you cannot keep an appointment, call and reschedule it. If you wait longer than 13 weeks between Depo-Provera contraceptive injections or longer than 14 weeks between Depo-subQ Provera 104 injections, you could get pregnant. Use another method for birth control if you miss your appointment. You may also need a pregnancy test before receiving another injection. What may interact with this medicine? Do not take this medicine with any of the following medications: -bosentan This medicine may also interact with the following medications: -aminoglutethimide -antibiotics or medicines for infections, especially rifampin, rifabutin, rifapentine, and griseofulvin -aprepitant -barbiturate medicines such as phenobarbital or primidone -bexarotene -carbamazepine -medicines for seizures like ethotoin, felbamate, oxcarbazepine, phenytoin, topiramate -modafinil -St. John's wort This list may not describe all possible interactions. Give your health care provider a list of all the medicines, herbs, non-prescription drugs, or dietary supplements you use. Also tell them if you smoke, drink alcohol, or use illegal drugs. Some items may interact with your medicine. What should I watch for while using this medicine? This drug does not protect you against HIV infection (AIDS) or other sexually transmitted diseases. Use of this product may cause you to lose calcium from your bones. Loss of calcium may cause weak bones (osteoporosis). Only use this product for more than 2 years if other forms of birth control are not right for you. The longer  you use this product for birth control  the more likely you will be at risk for weak bones. Ask your health care professional how you can keep strong bones. You may have a change in bleeding pattern or irregular periods. Many females stop having periods while taking this drug. If you have received your injections on time, your chance of being pregnant is very low. If you think you may be pregnant, see your health care professional as soon as possible. Tell your health care professional if you want to get pregnant within the next year. The effect of this medicine may last a long time after you get your last injection. What side effects may I notice from receiving this medicine? Side effects that you should report to your doctor or health care professional as soon as possible: -allergic reactions like skin rash, itching or hives, swelling of the face, lips, or tongue -breast tenderness or discharge -breathing problems -changes in vision -depression -feeling faint or lightheaded, falls -fever -pain in the abdomen, chest, groin, or leg -problems with balance, talking, walking -unusually weak or tired -yellowing of the eyes or skin Side effects that usually do not require medical attention (report to your doctor or health care professional if they continue or are bothersome): -acne -fluid retention and swelling -headache -irregular periods, spotting, or absent periods -temporary pain, itching, or skin reaction at site where injected -weight gain This list may not describe all possible side effects. Call your doctor for medical advice about side effects. You may report side effects to FDA at 1-800-FDA-1088. Where should I keep my medicine? This does not apply. The injection will be given to you by a health care professional. NOTE: This sheet is a summary. It may not cover all possible information. If you have questions about this medicine, talk to your doctor, pharmacist, or health care  provider.  2019 Elsevier/Gold Standard (2008-01-29 18:37:56)

## 2018-03-03 LAB — POCT PREGNANCY, URINE: Preg Test, Ur: NEGATIVE

## 2018-03-04 LAB — CYTOLOGY - PAP
Adequacy: ABSENT
Chlamydia: NEGATIVE
Diagnosis: NEGATIVE
HPV (WINDOPATH): NOT DETECTED
Neisseria Gonorrhea: NEGATIVE

## 2018-03-11 ENCOUNTER — Telehealth: Payer: Self-pay | Admitting: General Practice

## 2018-03-11 MED ORDER — METRONIDAZOLE 500 MG PO TABS: 2000.0000 mg | ORAL_TABLET | Freq: Once | ORAL | 0 refills | Status: AC

## 2018-03-11 NOTE — Addendum Note (Signed)
Addended by: Marcille Buffy D on: 03/11/2018 08:27 AM   Modules accepted: Orders

## 2018-03-11 NOTE — Telephone Encounter (Signed)
Called patient, no answer- left message to call us back for results.  

## 2018-03-11 NOTE — Telephone Encounter (Signed)
LM for pt to please give the office a call.  Attempted to call cell # unable to LM due message stating "please call again later".

## 2018-03-11 NOTE — Telephone Encounter (Signed)
-----   Message from Tresea Mall, CNM sent at 03/11/2018  8:26 AM EST ----- Patient with +trichomonas on pap. I have sent in RX. Please call her.

## 2018-03-12 ENCOUNTER — Telehealth: Payer: Self-pay

## 2018-03-12 NOTE — Telephone Encounter (Signed)
3rd attempt to reach Pt with results, no answer, could not leave message. Will send Certified Letter to Pt.

## 2018-04-28 ENCOUNTER — Encounter: Payer: Self-pay | Admitting: *Deleted

## 2018-04-29 ENCOUNTER — Encounter: Payer: Self-pay | Admitting: *Deleted

## 2018-05-19 ENCOUNTER — Ambulatory Visit: Payer: Medicaid Other

## 2018-07-01 ENCOUNTER — Encounter: Payer: Self-pay | Admitting: *Deleted

## 2020-03-18 IMAGING — US US MFM OB DETAIL+14 WK
1 series · 12 of 28 positions shown · non-contrast
Comparison: none

[Series 1: us mfm ob detail+14 wk · 81 acquisitions, 12 frames shown]
[im 3/81]
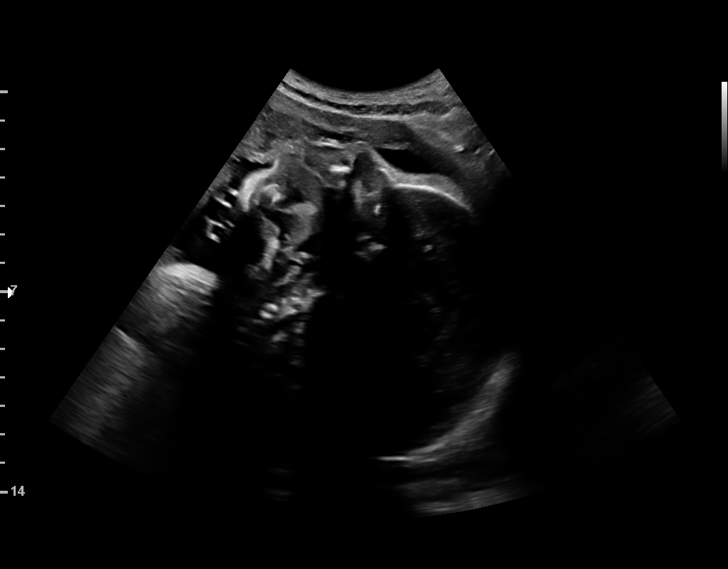
[im 9/81]
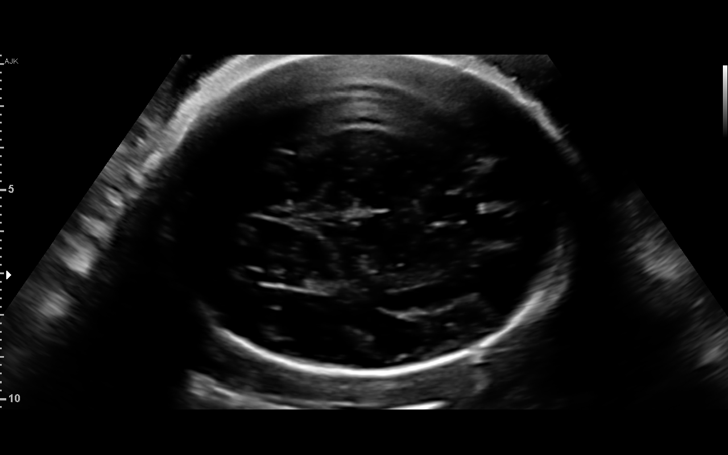
[im 15/81]
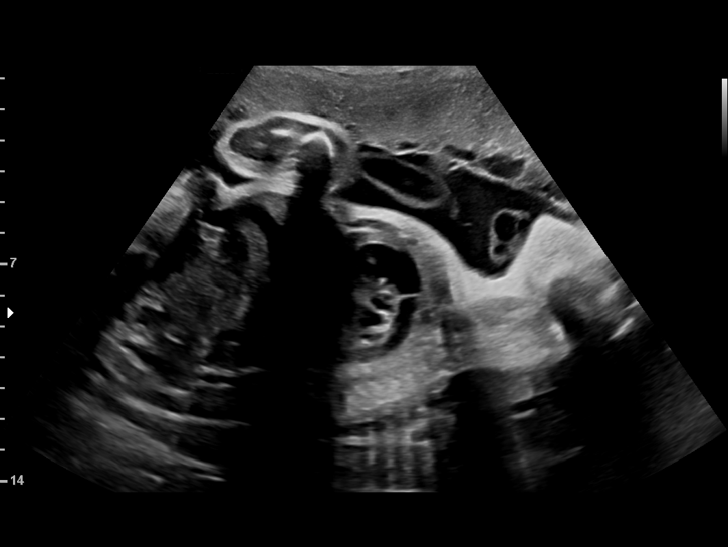
[im 24/81]
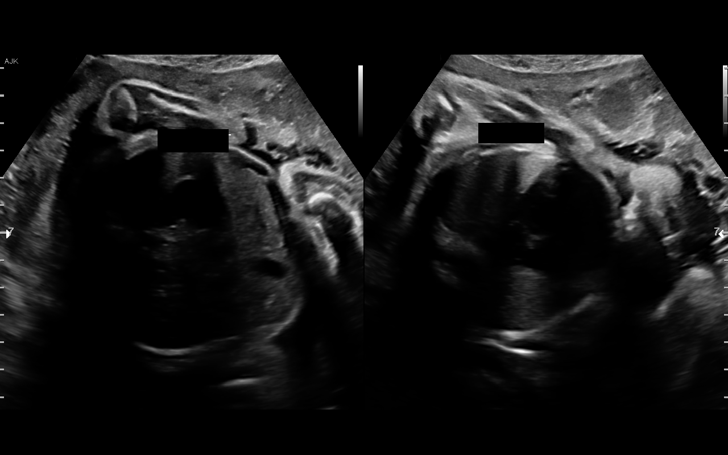
[im 30/81]
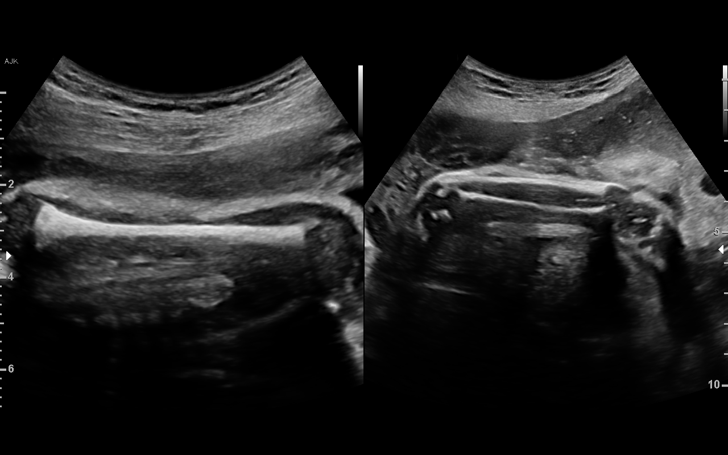
[im 36/81]
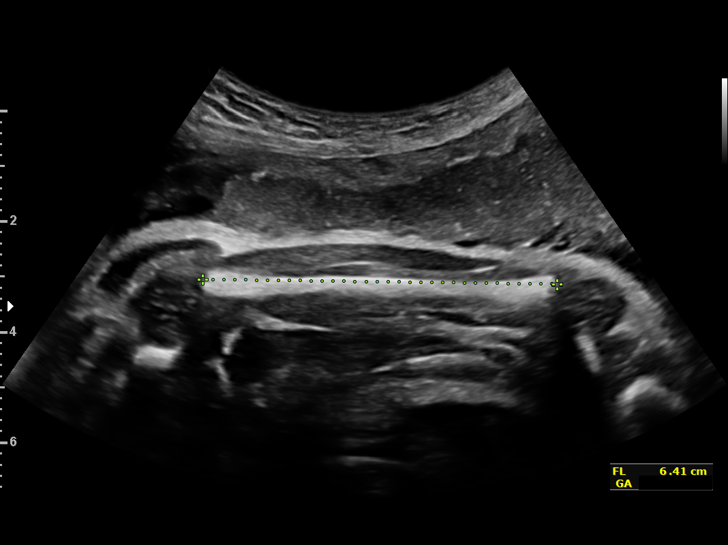
[im 45/81]
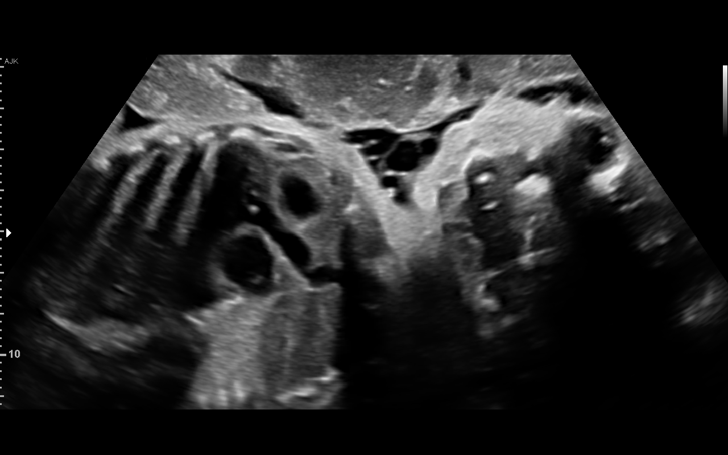
[im 51/81]
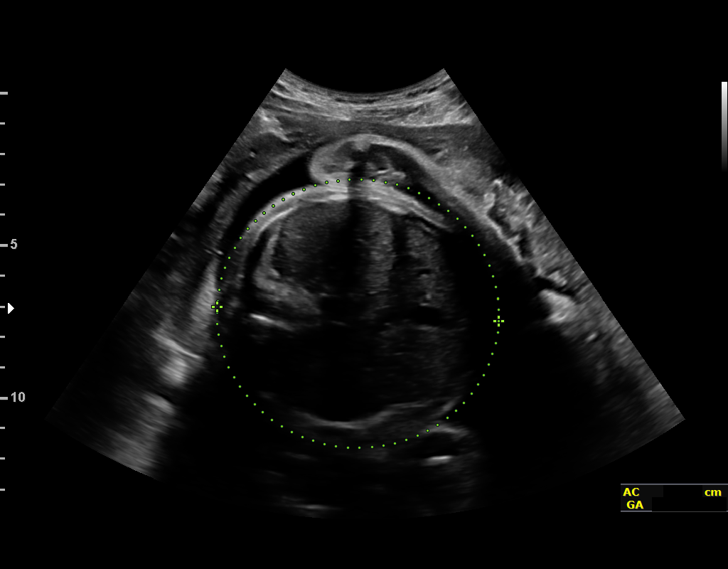
[im 57/81]
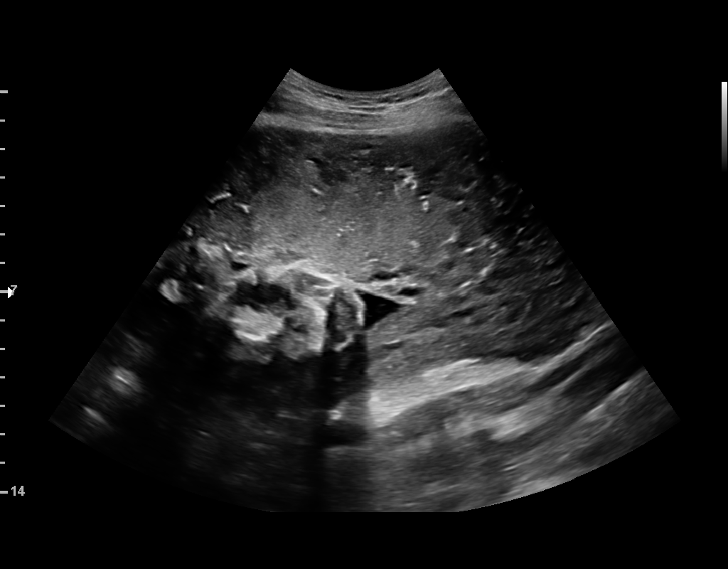
[im 66/81]
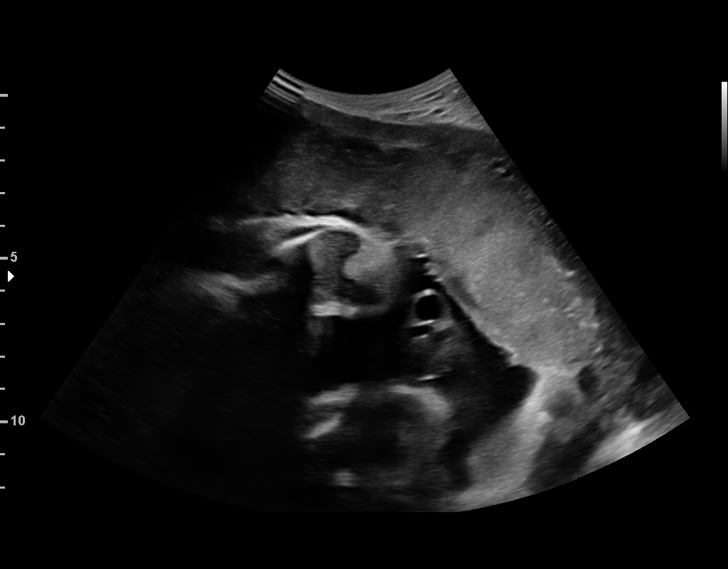
[im 72/81]
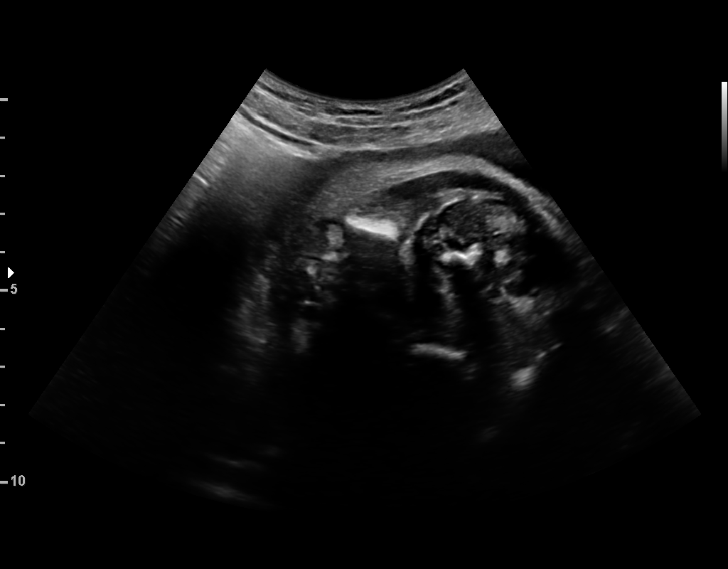
[im 78/81]
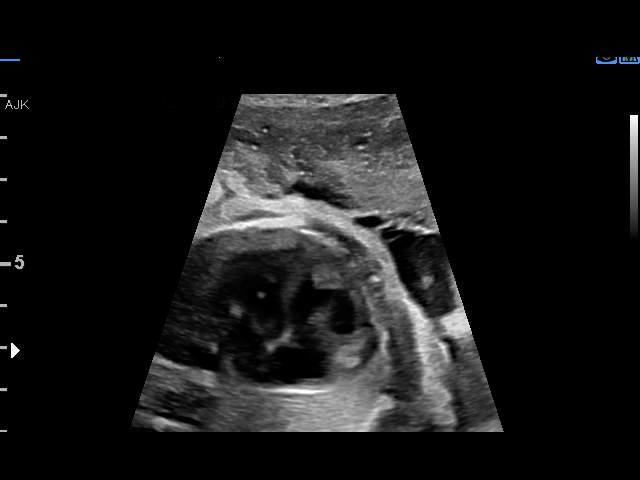

[12 of 28 positions shown; findings below may reference images not displayed]

CNM

 ----------------------------------------------------------------------

 ----------------------------------------------------------------------
Indications

  Encounter for uncertain dates
  Encounter for antenatal screening for
  malformations
  Late to prenatal care, third trimester
  Insufficient Prenatal Care
  Poor obstetric history: Previous IUFD
  (stillbirth- 23 week, d/t probable placental
  abruption)
  32 weeks gestation of pregnancy
 ----------------------------------------------------------------------
Vital Signs

 BMI:
Fetal Evaluation

 Num Of Fetuses:         1
 Fetal Heart Rate(bpm):  131
 Cardiac Activity:       Observed
 Presentation:           Cephalic
 Placenta:               Anterior
 P. Cord Insertion:      Visualized, central

 Amniotic Fluid
 AFI FV:      Within normal limits

 AFI Sum(cm)     %Tile       Largest Pocket(cm)
 10.65           21
 RUQ(cm)       RLQ(cm)       LUQ(cm)        LLQ(cm)

Biophysical Evaluation

 Amniotic F.V:   Within normal limits       F. Tone:        Observed
 F. Movement:    Observed                   Score:          [DATE]
 F. Breathing:   Observed
Biometry

 BPD:      77.3  mm     G. Age:  31w 0d         15  %    CI:        72.92   %    70 - 86
                                                         FL/HC:      22.1   %    19.1 -
 HC:      287.8  mm     G. Age:  31w 5d         10  %    HC/AC:      1.01        0.96 -
 AC:      284.1  mm     G. Age:  32w 3d         61  %    FL/BPD:     82.4   %    71 - 87
 FL:       63.7  mm     G. Age:  33w 0d         63  %    FL/AC:      22.4   %    20 - 24
 HUM:      57.9  mm     G. Age:  33w 4d         83  %
 CER:        41  mm     G. Age:  35w 0d         86  %
 LV:        2.9  mm

 Est. FW:    3615  gm      4 lb 5 oz     63  %
Gestational Age

 U/S Today:     32w 0d                                        EDD:   02/04/18
 Best:          32w 0d     Det. By:  U/S (12/10/17)           EDD:   02/04/18
Anatomy

 Cranium:               Appears normal         Aortic Arch:            Appears normal
 Cavum:                 Appears normal         Ductal Arch:            Appears normal
 Ventricles:            Appears normal         Diaphragm:              Appears normal
 Choroid Plexus:        Appears normal         Stomach:                Appears normal, left
                                                                       sided
 Cerebellum:            Appears normal         Abdomen:                Appears normal
 Posterior Fossa:       Appears normal         Abdominal Wall:         Appears nml (cord
                                                                       insert, abd wall)
 Nuchal Fold:           Not applicable (>20    Cord Vessels:           Appears normal (3
                        wks GA)                                        vessel cord)
 Face:                  Appears normal         Kidneys:                Appear normal
                        (orbits and profile)
 Lips:                  Appears normal         Bladder:                Appears normal
 Thoracic:              Appears normal         Spine:                  Ltd views no
                                                                       intracranial signs of
                                                                       NT
 Heart:                 Appears normal         Upper Extremities:      limited views
                        (4CH, axis, and                                appears normal
                        situs)
 RVOT:                  Appears normal         Lower Extremities:      Limited views
                                                                       appear normal
 LVOT:                  Appears normal

 Other:  Fetus appears to be female. Technically difficult due to advanced
         gestational age.
Cervix Uterus Adnexa

 Cervix
 Not visualized (advanced GA >84wks)
 Uterus
 No abnormality visualized.

 Left Ovary
 Not visualized.

 Right Ovary
 Not visualized.

 Adnexa
 No abnormality visualized.
Comments

 U/S images reviewed.   Appropriate fetal growth is noted.   No
 fetal abnormalities are seen.  No evidence of fetal
 compromise is found on BPP today.  Patient did not wish to
 stay to see physician.
 Recommendations: 1) Serial U/S every 4 weeks for fetal
 growth  2) Weekly BPP (h/o IUFD)
Recommendations

 1) Serial U/S every 4 weeks for fetal growth  2) Weekly BPP
 (h/o IUFD)

              Onacram, Don Lolito

## 2020-06-15 ENCOUNTER — Encounter: Payer: Self-pay | Admitting: *Deleted

## 2020-12-29 ENCOUNTER — Encounter: Payer: Self-pay | Admitting: *Deleted

## 2021-01-02 ENCOUNTER — Telehealth (INDEPENDENT_AMBULATORY_CARE_PROVIDER_SITE_OTHER): Payer: Medicaid Other | Admitting: *Deleted

## 2021-01-02 ENCOUNTER — Other Ambulatory Visit: Payer: Self-pay

## 2021-01-02 ENCOUNTER — Telehealth: Payer: Self-pay | Admitting: *Deleted

## 2021-01-02 DIAGNOSIS — O2620 Pregnancy care for patient with recurrent pregnancy loss, unspecified trimester: Secondary | ICD-10-CM

## 2021-01-02 DIAGNOSIS — O09899 Supervision of other high risk pregnancies, unspecified trimester: Secondary | ICD-10-CM

## 2021-01-02 DIAGNOSIS — O09219 Supervision of pregnancy with history of pre-term labor, unspecified trimester: Secondary | ICD-10-CM

## 2021-01-02 DIAGNOSIS — O0990 Supervision of high risk pregnancy, unspecified, unspecified trimester: Secondary | ICD-10-CM | POA: Insufficient documentation

## 2021-01-02 DIAGNOSIS — Z8759 Personal history of other complications of pregnancy, childbirth and the puerperium: Secondary | ICD-10-CM

## 2021-01-02 DIAGNOSIS — I10 Essential (primary) hypertension: Secondary | ICD-10-CM

## 2021-01-02 MED ORDER — BLOOD PRESSURE KIT DEVI: 1.0000 | 0 refills | Status: DC | PRN

## 2021-01-02 MED ORDER — COMPLETENATE 29-1 MG PO CHEW: 1.0000 | CHEWABLE_TABLET | Freq: Every day | ORAL | 11 refills | Status: AC

## 2021-01-02 MED ORDER — GOJJI WEIGHT SCALE MISC: 1.0000 | 0 refills | Status: DC | PRN

## 2021-01-02 NOTE — Telephone Encounter (Signed)
I called [facility] to request the [place] report they completed. I had discussed with Shirl during her virtual visit earlier today and she was in agreement. They will contact her to get her permission and then will fax the report. Bethany Hirt,RN

## 2021-01-02 NOTE — Progress Notes (Signed)
10:15. Katlen not connected virtually. I called her and explained how to join virtually and that if we do not connect, I will call her back.  Felma Pfefferle,RN 10:23 I called [place] back because she is not connected . She states she is having trouble and is not good with computer stuff. I informed her we can do visit by phone if she desires.  New OB Intake  I connected with  Regina Newman on 01/02/21 at 10:15 AM EST by telephone Video Visit and verified that I am speaking with the correct person using two identifiers. Nurse is located at [facility] and pt is located at home.  I discussed the limitations, risks, security and privacy concerns of performing an evaluation and management service by telephone and the availability of in person appointments. I also discussed with the patient that there may be a patient responsible charge related to this service. The patient expressed understanding and agreed to proceed.  I explained I am completing New OB Intake today. We discussed her EDD of 07/17/21 that is based on early [facility].. Pt is G5/P2203. I reviewed her allergies, medications, Medical/Surgical/OB history, and appropriate screenings. I informed her of [facility] services. Based on history, this is a/an  pregnancy complicated by history IUFD, hx PTD, History poor prenatal care, history delivered at home  , hx HTN.   Patient Active Problem List   Diagnosis Date Noted   Supervision of high risk pregnancy, antepartum 01/02/2021   History of IUFD 01/02/2021   History of preterm delivery, currently pregnant 01/02/2021   Tobacco abuse 01/19/2018   Prior pregnancy with placenta abruption, antepartum 02/17/2012   Previous child with [place]- Dx @ age 32 02/17/2012    Concerns addressed today  Delivery Plans:  Plans to deliver at [facility].   MyChart/Babyscripts MyChart access verified. I explained pt will have some visits in office and some virtually. Will need help with MyChart. Babyscripts  instructions given and order placed. Patient verifies receipt of registration text/e-mail.   Blood Pressure Cuff  Blood pressure cuff ordered for patient to pick-up from [employer]. Explained after first prenatal appt pt will check weekly and document in 42.  Weight scale: Patient does not have weight scale. Weight scale ordered for patient to pick up form [facility].   Anatomy US Explained first scheduled [place] will be around 19 weeks. Anatomy US will be scheduled and patient notified by MyChart. .  Labs Discussed Johnsie Cancel genetic screening with patient. Would like both Panorama and Horizon drawn at new OB visit. Routine prenatal labs needed.  Covid Vaccine Patient has had covid vaccine.  I asked her to bring her vaccination record to new ob appointment.   Centering in Pregnancy Candidate?  If yes, offer as possibility. Patient not candidate due to high risk status.   Mother/ Baby Dyad Candidate?    If yes, offer as possibility. Patient not candidate due to multipara.   Informed patient of [facility] website  and placed link in her AVS.   Social Determinants of Health Food Insecurity: Patient denies food insecurity. WIC Referral: Patient is not interested in referral to [facility]. She states she  has [place].  Transportation: Patient denies transportation needs. Childcare: Discussed no children allowed at ultrasound appointments. Offered childcare services; patient declines childcare services at this time.  Send link to Pregnancy Navigators   Placed OB Box on problem list and updated  First visit review I reviewed new OB appt with pt. I  explained she will have a pelvic exam, ob bloodwork with genetic screening, and PAP smear. Explained pt will be seen by Dr. Rip Harbour at first visit; encounter routed to appropriate provider. Explained that patient will be seen by pregnancy navigator following visit with provider. [facility] information placed in AVS.   Antonio Woodhams,RN 01/02/2021   11:10 AM

## 2021-01-02 NOTE — Patient Instructions (Addendum)
°  At our [facility], we work as an integrated team, providing care to address both physical and emotional health. Your medical provider may refer you to see our Behavioral Health Clinician (BHC) on the same day you see your medical provider, as availability permits; often scheduled virtually at your convenience.  Our BHC is available to all patients, visits generally last between 20-30 minutes, but can be longer or shorter, depending on patient need. The BHC offers help with stress management, coping with symptoms of depression and anxiety, major life changes , sleep issues, changing risky behavior, grief and loss, life stress, working on personal life goals, and  behavioral health issues, as these all affect your overall health and wellness.  The BHC is NOT available for the following: FMLA paperwork, court-ordered evaluations, specialty assessments (custody or disability), letters to employers, or obtaining certification for an emotional support animal. The BHC does not provide long-term therapy. You have the right to refuse integrated behavioral health services, or to reschedule to see the BHC at a later date.  Confidentiality exception: If it is suspected that a child or disabled adult is being abused or neglected, we are required by law to report that to either Child Protective Services or Adult Protective Services.  If you have a diagnosis of Bipolar affective disorder, Schizophrenia, or recurrent Major depressive disorder, we will recommend that you establish care with a psychiatrist, as these are lifelong, chronic conditions, and we want your overall emotional health and medications to be more closely monitored. If you anticipate needing extended maternity leave due to mental health issues postpartum, it it recommended you inform your medical provider, so we can put in a referral to a psychiatrist as soon as possible. The BHC is unable to recommend an extended maternity leave for mental  health issues. Your medical provider or BHC may refer you to a therapist for ongoing, traditional therapy, or to a psychiatrist, for medication management, if it would benefit your overall health. Depending on your insurance, you may have a copay or be charged a deductible, depending on your insurance, to see the BHC. If you are uninsured, it is recommended that you apply for financial assistance. (Forms may be requested at the front desk for in-person visits, via MyChart, or request a form during a virtual visit).  If you see the BHC more than 6 times, you will have to complete a comprehensive clinical assessment interview with the BHC to resume integrated services.  For virtual visits with the BHC, you must be physically in the state of [place] at the time of the visit. For example, if you live in [place], you will have to do an in-person visit with the BHC, and your out-of-state insurance may not cover behavioral health services in [place]. If you are going out of the state or country for any reason, the BHC may see you virtually when you return to [place], but not while you are physically outside of [place].    [facility] is a website with information to help you prepare for Labor and delivery, patient information, visitor information and more.    Here is a link to the Pregnancy Navigators . Please Fill out prior to your New OB appointment.   English Link: [email or web address]  Spanish Link: [email or web address]  

## 2021-01-02 NOTE — Progress Notes (Addendum)
9:20 Patient not connected for Virtual appointment. I called her phone number and left a message I am calling for your virtual appointment. Please connect if you can or call us if you need to cancel or reschedule Regina Benko,RN 0927 Patient still not connected virtually for her virtual appointment. I called her phone and left a message I am calling for your virtual appointment and since I have not connected with you; you will need to call to reschedule.  I also called her contact number for Ted and was unable to leave a message- heard phone ring and this disconnect. Regina Sporer,RN  [phone] patient called front desk and asked about completing her appointment. Staff informed her we will try to do her at a later time if someone cancels, otherwise will be rescheduled.  Regina Malachi,RN

## 2021-01-09 ENCOUNTER — Encounter (HOSPITAL_COMMUNITY): Payer: Self-pay

## 2021-01-09 ENCOUNTER — Other Ambulatory Visit: Payer: Self-pay

## 2021-01-09 ENCOUNTER — Emergency Department (HOSPITAL_COMMUNITY)
Admission: EM | Admit: 2021-01-09 | Discharge: 2021-01-10 | Disposition: A | Discharge status: Home / Self Care | Payer: Medicaid Other | Attending: Emergency Medicine | Admitting: Emergency Medicine

## 2021-01-09 DIAGNOSIS — Y92410 Unspecified street and highway as the place of occurrence of the external cause: Secondary | ICD-10-CM | POA: Diagnosis not present

## 2021-01-09 DIAGNOSIS — Z3A14 14 weeks gestation of pregnancy: Secondary | ICD-10-CM | POA: Diagnosis not present

## 2021-01-09 DIAGNOSIS — R0789 Other chest pain: Secondary | ICD-10-CM | POA: Insufficient documentation

## 2021-01-09 DIAGNOSIS — Z5321 Procedure and treatment not carried out due to patient leaving prior to being seen by health care provider: Secondary | ICD-10-CM | POA: Insufficient documentation

## 2021-01-09 DIAGNOSIS — O26891 Other specified pregnancy related conditions, first trimester: Secondary | ICD-10-CM | POA: Insufficient documentation

## 2021-01-09 NOTE — ED Notes (Signed)
Pt stated that she had to leave
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
## 2021-01-12 ENCOUNTER — Other Ambulatory Visit: Payer: Self-pay

## 2021-01-12 ENCOUNTER — Ambulatory Visit (INDEPENDENT_AMBULATORY_CARE_PROVIDER_SITE_OTHER): Payer: Medicaid Other | Admitting: Obstetrics and Gynecology

## 2021-01-12 ENCOUNTER — Encounter: Payer: Self-pay | Admitting: *Deleted

## 2021-01-12 ENCOUNTER — Encounter: Payer: Self-pay | Admitting: Obstetrics and Gynecology

## 2021-01-12 ENCOUNTER — Other Ambulatory Visit (HOSPITAL_COMMUNITY)
Admission: RE | Admit: 2021-01-12 | Discharge: 2021-01-12 | Disposition: A | Discharge status: Home / Self Care | Payer: Medicaid Other | Source: Ambulatory Visit | Attending: Obstetrics and Gynecology | Admitting: Obstetrics and Gynecology

## 2021-01-12 VITALS — BP 119/64 | HR 75 | Wt 130.1 lb

## 2021-01-12 DIAGNOSIS — O09899 Supervision of other high risk pregnancies, unspecified trimester: Secondary | ICD-10-CM

## 2021-01-12 DIAGNOSIS — Z3480 Encounter for supervision of other normal pregnancy, unspecified trimester: Secondary | ICD-10-CM | POA: Diagnosis not present

## 2021-01-12 DIAGNOSIS — O0990 Supervision of high risk pregnancy, unspecified, unspecified trimester: Secondary | ICD-10-CM

## 2021-01-12 DIAGNOSIS — O09299 Supervision of pregnancy with other poor reproductive or obstetric history, unspecified trimester: Secondary | ICD-10-CM

## 2021-01-12 DIAGNOSIS — Z3009 Encounter for other general counseling and advice on contraception: Secondary | ICD-10-CM

## 2021-01-12 DIAGNOSIS — Z23 Encounter for immunization: Secondary | ICD-10-CM

## 2021-01-12 DIAGNOSIS — Z8759 Personal history of other complications of pregnancy, childbirth and the puerperium: Secondary | ICD-10-CM | POA: Diagnosis not present

## 2021-01-12 DIAGNOSIS — O161 Unspecified maternal hypertension, first trimester: Secondary | ICD-10-CM | POA: Diagnosis not present

## 2021-01-12 MED ORDER — ASPIRIN EC 81 MG PO TBEC: 81.0000 mg | DELAYED_RELEASE_TABLET | Freq: Every day | ORAL | 2 refills | Status: AC

## 2021-01-12 NOTE — Patient Instructions (Signed)
First Trimester of Pregnancy °The first trimester of pregnancy starts on the first day of your last menstrual period until the end of week 12. This is months 1 through 3 of pregnancy. A week after a sperm fertilizes an egg, the egg will implant into the wall of the uterus and begin to develop into a baby. By the end of 12 weeks, all the baby's organs will be formed and the baby will be 2-3 inches in size. °Body changes during your first trimester °Your body goes through many changes during pregnancy. The changes vary and generally return to normal after your baby is born. °Physical changes °You may gain or lose weight. °Your breasts may begin to grow larger and become tender. The tissue that surrounds your nipples (areola) may become darker. °Dark spots or blotches (chloasma or mask of pregnancy) may develop on your face. °You may have changes in your hair. These can include thickening or thinning of your hair or changes in texture. °Health changes °You may feel nauseous, and you may vomit. °You may have heartburn. °You may develop headaches. °You may develop constipation. °Your gums may bleed and may be sensitive to brushing and flossing. °Other changes °You may tire easily. °You may urinate more often. °Your menstrual periods will stop. °You may have a loss of appetite. °You may develop cravings for certain kinds of food. °You may have changes in your emotions from day to day. °You may have more vivid and strange dreams. °Follow these instructions at home: °Medicines °Follow your health care provider's instructions regarding medicine use. Specific medicines may be either safe or unsafe to take during pregnancy. Do not take any medicines unless told to by your health care provider. °Take a prenatal vitamin that contains at least 600 micrograms (mcg) of folic acid. °Eating and drinking °Eat a healthy diet that includes fresh fruits and vegetables, whole grains, good sources of protein such as meat, eggs, or tofu,  and low-fat dairy products. °Avoid raw meat and unpasteurized juice, milk, and cheese. These carry germs that can harm you and your baby. °If you feel nauseous or you vomit: °Eat 4 or 5 small meals a day instead of 3 large meals. °Try eating a few soda crackers. °Drink liquids between meals instead of during meals. °You may need to take these actions to prevent or treat constipation: °Drink enough fluid to keep your urine pale yellow. °Eat foods that are high in fiber, such as beans, whole grains, and fresh fruits and vegetables. °Limit foods that are high in fat and processed sugars, such as fried or sweet foods. °Activity °Exercise only as directed by your health care provider. Most people can continue their usual exercise routine during pregnancy. Try to exercise for 30 minutes at least 5 days a week. °Stop exercising if you develop pain or cramping in the lower abdomen or lower back. °Avoid exercising if it is very hot or humid or if you are at high altitude. °Avoid heavy lifting. °If you choose to, you may have sex unless your health care provider tells you not to. °Relieving pain and discomfort °Wear a good support bra to relieve breast tenderness. °Rest with your legs elevated if you have leg cramps or low back pain. °If you develop bulging veins (varicose veins) in your legs: °Wear support hose as told by your health care provider. °Elevate your feet for 15 minutes, 3-4 times a day. °Limit salt in your diet. °Safety °Wear your seat belt at all times when driving   or riding in a car. °Talk with your health care provider if someone is verbally or physically abusive to you. °Talk with your health care provider if you are feeling sad or have thoughts of hurting yourself. °Lifestyle °Do not use hot tubs, steam rooms, or saunas. °Do not douche. Do not use tampons or scented sanitary pads. °Do not use herbal remedies, alcohol, illegal drugs, or medicines that are not approved by your health care provider. Chemicals  in these products can harm your baby. °Do not use any products that contain nicotine or tobacco, such as cigarettes, e-cigarettes, and chewing tobacco. If you need help quitting, ask your health care provider. °Avoid cat litter boxes and soil used by cats. These carry germs that can cause birth defects in the baby and possibly loss of the unborn baby (fetus) by miscarriage or stillbirth. °General instructions °During routine prenatal visits in the first trimester, your health care provider will do a physical exam, perform necessary tests, and ask you how things are going. Keep all follow-up visits. This is important. °Ask for help if you have counseling or nutritional needs during pregnancy. Your health care provider can offer advice or refer you to specialists for help with various needs. °Schedule a dentist appointment. At home, brush your teeth with a soft toothbrush. Floss gently. °Write down your questions. Take them to your prenatal visits. °Where to find more information °American Pregnancy Association: americanpregnancy.org °American College of Obstetricians and Gynecologists: acog.org/en/Womens%20Health/Pregnancy °Office on Women's Health: womenshealth.gov/pregnancy °Contact a health care provider if you have: °Dizziness. °A fever. °Mild pelvic cramps, pelvic pressure, or nagging pain in the abdominal area. °Nausea, vomiting, or diarrhea that lasts for 24 hours or longer. °A bad-smelling vaginal discharge. °Pain when you urinate. °Known exposure to a contagious illness, such as chickenpox, measles, Zika virus, HIV, or hepatitis. °Get help right away if you have: °Spotting or bleeding from your vagina. °Severe abdominal cramping or pain. °Shortness of breath or chest pain. °Any kind of trauma, such as from a fall or a car crash. °New or increased pain, swelling, or redness in an arm or leg. °Summary °The first trimester of pregnancy starts on the first day of your last menstrual period until the end of week  12 (months 1 through 3). °Eating 4 or 5 small meals a day rather than 3 large meals may help to relieve nausea and vomiting. °Do not use any products that contain nicotine or tobacco, such as cigarettes, e-cigarettes, and chewing tobacco. If you need help quitting, ask your health care provider. °Keep all follow-up visits. This is important. °This information is not intended to replace advice given to you by your health care provider. Make sure you discuss any questions you have with your health care provider. °Document Revised: 06/16/2019 Document Reviewed: 04/22/2019 °Elsevier Patient Education © 2022 Elsevier Inc. ° °

## 2021-01-12 NOTE — Progress Notes (Signed)
Subjective:  Regina Newman is a 35 y.o. [phone] at 47w3dbeing seen today for first OB visit. EDD by first trimester U/S. Denies any chronic medical problems or medications. H/O PPROM with PTD 12/19 at 3565/7 weeks, H/O GHTN with last pregnancy, did not take meds and no follow up, H/O FDIU in the past.  She is currently monitored for the following issues for this high-risk pregnancy and has Prior pregnancy with placenta abruption, antepartum; Previous [place] D[place] @ age 89 Tobacco abuse; Supervision of high risk pregnancy, antepartum; History of IUFD; History of preterm delivery, currently pregnant; Unwanted fertility; and History of gestational hypertension on their problem list.  Patient reports no complaints.  Contractions: Not present. Vag. Bleeding: None.  Movement: Absent. Denies leaking of fluid.   The following portions of the patient's history were reviewed and updated as appropriate: allergies, current medications, past family history, past medical history, past social history, past surgical history and problem list. Problem list updated.  Objective:   Vitals:   01/12/21 0847  BP: 119/64  Pulse: 75  Weight: 130 lb 1.6 oz (59 kg)    Fetal Status: Fetal Heart Rate (bpm): 151   Movement: Absent     General:  Alert, oriented and cooperative. Patient is in no acute distress.  Skin: Skin is warm and dry. No rash noted.   Cardiovascular: Normal heart rate noted  Respiratory: Normal respiratory effort, no problems with respiration noted  Abdomen: Soft, gravid, appropriate for gestational age. Pain/Pressure: Absent     Pelvic:  Cervical exam performed        Extremities: Normal range of motion.  Edema: None  Mental Status: Normal mood and affect. Normal behavior. Normal judgment and thought content.   Urinalysis:      Assessment and Plan:  Pregnancy: [id number] 17w3d1. Supervision of high risk pregnancy, antepartum Prenatal and care reviewed with pt Genetic testing  discussed - Hemoglobin A1c - Genetic Screening - Culture, OB Urine - [facility]/D/Plt+RPR+Rh+ABO+RubIgG...  2.H/O GHTN Instructed to monitor BP during pregnancy Indications for qd BASA reviewed with pt - Comp Met (CMET) - Protein / creatinine ratio, urine - aspirin EC 81 MG tablet; Take 1 tablet (81 mg total) by mouth daily. Take after 12 weeks for prevention of preeclampsia later in pregnancy  Dispense: 300 tablet; Refill: 2  3. History of IUFD Serial growth scans and antenatal testing as per MFM protocol  4. History of preterm delivery, currently pregnant Due to PROM at 36 5/7 weeks   5. Prior pregnancy with placenta abruption, antepartum Stable  6. [facility]   Preterm labor symptoms and general obstetric precautions including but not limited to vaginal bleeding, contractions, leaking of fluid and fetal movement were reviewed in detail with the patient. Please refer to After Visit Summary for other counseling recommendations.  Return in about 4 weeks (around 02/09/2021) for OB visit, face to face, MD only.   ErChancy MilroyMD

## 2021-01-14 LAB — CULTURE, OB URINE

## 2021-01-14 LAB — URINE CULTURE, OB REFLEX

## 2021-01-16 LAB — GC/CHLAMYDIA PROBE AMP (~~LOC~~) NOT AT ARMC
Chlamydia: NEGATIVE
Comment: NEGATIVE
Comment: NORMAL
Neisseria Gonorrhea: NEGATIVE

## 2021-01-17 LAB — COMPREHENSIVE METABOLIC PANEL
ALT: 13 IU/L (ref 0–32)
AST: 27 IU/L (ref 0–40)
Albumin/Globulin Ratio: 1.4 (ref 1.2–2.2)
Albumin: 3.9 g/dL (ref 3.8–4.8)
Alkaline Phosphatase: 91 IU/L (ref 44–121)
BUN/Creatinine Ratio: 10 (ref 9–23)
BUN: 6 mg/dL (ref 6–20)
Bilirubin Total: 0.2 mg/dL (ref 0.0–1.2)
CO2: 21 mmol/L (ref 20–29)
Calcium: 9.2 mg/dL (ref 8.7–10.2)
Chloride: 102 mmol/L (ref 96–106)
Creatinine, Ser: 0.58 mg/dL (ref 0.57–1.00)
Globulin, Total: 2.8 g/dL (ref 1.5–4.5)
Glucose: 77 mg/dL (ref 70–99)
Potassium: 4.2 mmol/L (ref 3.5–5.2)
Sodium: 135 mmol/L (ref 134–144)
Total Protein: 6.7 g/dL (ref 6.0–8.5)
eGFR: 122 mL/min/{1.73_m2} (ref >59)

## 2021-01-17 LAB — CBC/D/PLT+RPR+RH+ABO+RUBIGG...
Antibody Screen: NEGATIVE
Basophils Absolute: 0 10*3/uL (ref 0.0–0.2)
Basos: 0 %
EOS (ABSOLUTE): 0.2 10*3/uL (ref 0.0–0.4)
Eos: 2 %
HCV Ab: <0.1 s/co ratio (ref 0.0–0.9)
HIV Screen 4th Generation wRfx: NONREACTIVE
Hematocrit: 25.2 % — ABNORMAL LOW (ref 34.0–46.6)
Hemoglobin: 7.6 g/dL — ABNORMAL LOW (ref 11.1–15.9)
Hepatitis B Surface Ag: NEGATIVE
Immature Grans (Abs): 0.1 10*3/uL (ref 0.0–0.1)
Immature Granulocytes: 1 %
Lymphocytes Absolute: 2.5 10*3/uL (ref 0.7–3.1)
Lymphs: 25 %
MCH: 20 pg — ABNORMAL LOW (ref 26.6–33.0)
MCHC: 30.2 g/dL — ABNORMAL LOW (ref 31.5–35.7)
MCV: 66 fL — ABNORMAL LOW (ref 79–97)
Monocytes Absolute: 0.6 10*3/uL (ref 0.1–0.9)
Monocytes: 6 %
Neutrophils Absolute: 6.5 10*3/uL (ref 1.4–7.0)
Neutrophils: 66 %
Platelets: 387 10*3/uL (ref 150–450)
RBC: 3.8 x10E6/uL (ref 3.77–5.28)
RDW: 17.3 % — ABNORMAL HIGH (ref 11.7–15.4)
RPR Ser Ql: NONREACTIVE
Rh Factor: POSITIVE
Rubella Antibodies, IGG: 1.04 index (ref >0.99)
WBC: 9.9 10*3/uL (ref 3.4–10.8)

## 2021-01-17 LAB — PROTEIN / CREATININE RATIO, URINE
Creatinine, Urine: 133.2 mg/dL
Protein, Ur: 24.4 mg/dL
Protein/Creat Ratio: 183 mg/g creat (ref 0–200)

## 2021-01-17 LAB — HEMOGLOBIN A1C
Est. average glucose Bld gHb Est-mCnc: 88 mg/dL
Hgb A1c MFr Bld: 4.7 % — ABNORMAL LOW (ref 4.8–5.6)

## 2021-01-17 LAB — HCV INTERPRETATION

## 2021-01-21 ENCOUNTER — Other Ambulatory Visit: Payer: Self-pay | Admitting: Obstetrics and Gynecology

## 2021-01-23 ENCOUNTER — Telehealth: Payer: Self-pay | Admitting: General Practice

## 2021-01-23 NOTE — Telephone Encounter (Signed)
Scheduled iron infusion 1/11 @ 830. Called patient and discussed with her. Patient states she is currently out of town and would like a later appt. Rescheduled for 1/18 @ 9am and informed patient. Patient verbalized understanding.

## 2021-01-23 NOTE — Telephone Encounter (Signed)
-----   Message from Chancy Milroy, MD sent at 01/21/2021 11:44 AM EST ----- Please schedule pt for out pt iron transfusion. I have placed orders Please let pt know   Thanks Legrand Como

## 2021-01-26 ENCOUNTER — Telehealth: Payer: Self-pay | Admitting: Lactation Services

## 2021-01-26 DIAGNOSIS — D573 Sickle-cell trait: Secondary | ICD-10-CM | POA: Insufficient documentation

## 2021-01-26 NOTE — Telephone Encounter (Signed)
Called patient to inform patient of [place] shows she is carrier for Sickle Cell Disease. Patient did not answer. LM for her to call the office at her convenience and to check her My Chart message.

## 2021-01-31 ENCOUNTER — Encounter (HOSPITAL_COMMUNITY): Payer: Medicaid Other

## 2021-02-07 ENCOUNTER — Non-Acute Institutional Stay (HOSPITAL_COMMUNITY)
Admission: RE | Admit: 2021-02-07 | Discharge: 2021-02-07 | Disposition: A | Discharge status: Home / Self Care | Payer: Medicaid Other | Source: Ambulatory Visit | Attending: Internal Medicine | Admitting: Internal Medicine

## 2021-02-07 ENCOUNTER — Other Ambulatory Visit: Payer: Self-pay

## 2021-02-07 MED ORDER — SODIUM CHLORIDE 0.9 % IV SOLN
INTRAVENOUS | Status: DC | PRN
Start: 2021-02-07 — End: 2021-02-08

## 2021-02-07 MED ORDER — SODIUM CHLORIDE 0.9 % IV SOLN
500.0000 mg | Freq: Once | INTRAVENOUS | Status: DC
  Filled 2021-02-07: qty 25

## 2021-02-07 NOTE — Progress Notes (Signed)
Pt arrived to [facility] [place] for infusion, accompanied by a 34 year old child. Informed patient visitors have to be over the age of 82, and asked if someone could pick up her child so she could receive infusion today. Pt states she does not have childcare and will reschedule infusion to a later date. Offered patient the phone number to the scheduler, pt states she has it and will call to make appointment when she can arrange childcare. Reviewed with pt that the infusion will last 4-5 hours, verbalized understanding.

## 2021-02-08 ENCOUNTER — Non-Acute Institutional Stay (HOSPITAL_COMMUNITY)
Admission: RE | Admit: 2021-02-08 | Discharge: 2021-02-08 | Disposition: A | Discharge status: Home / Self Care | Payer: No Typology Code available for payment source | Source: Ambulatory Visit | Attending: Internal Medicine | Admitting: Internal Medicine

## 2021-02-08 DIAGNOSIS — D649 Anemia, unspecified: Secondary | ICD-10-CM | POA: Diagnosis present

## 2021-02-08 DIAGNOSIS — O99012 Anemia complicating pregnancy, second trimester: Secondary | ICD-10-CM | POA: Insufficient documentation

## 2021-02-08 MED ORDER — SODIUM CHLORIDE 0.9 % IV SOLN: INTRAVENOUS | Status: DC | PRN

## 2021-02-08 MED ORDER — SODIUM CHLORIDE 0.9 % IV SOLN
500.0000 mg | Freq: Once | INTRAVENOUS | Status: AC
  Administered 2021-02-08: 500 mg via INTRAVENOUS
  Filled 2021-02-08: qty 25

## 2021-02-08 NOTE — Progress Notes (Signed)
PATIENT CARE CENTER NOTE   Diagnosis: Anemia    Provider: Arlina Robes, MD   Procedure: Venofer infusion    Note:  Patient received Venofer 500 mg infusion (dose 1 of 1) via PIV. No pre-medication ordered. Patient tolerated infusion well with no adverse reaction. Vital signs stable. Patient unable to wait for 1 hour post infusion. Discharge instructions given. Patient alert, oriented and ambulatory at discharge.

## 2021-02-08 NOTE — Progress Notes (Signed)
° °  PRENATAL VISIT NOTE  Subjective:  Regina Newman is a 36 y.o. [phone] at [redacted]w[redacted]d being seen today for ongoing prenatal care.  She is currently monitored for the following issues for this high-risk pregnancy and has Prior pregnancy with placenta abruption, antepartum; Previous child with [place]- Dx @ age 31; Tobacco abuse; Supervision of high risk pregnancy, antepartum; History of IUFD; History of preterm delivery, currently pregnant; Unwanted fertility; History of gestational hypertension; Sickle cell trait ([place]); and Anemia of pregnancy in second trimester on their problem list.  Patient reports no complaints.  Contractions: Not present.  .  Movement: Present. Denies leaking of fluid.   The following portions of the patient's history were reviewed and updated as appropriate: allergies, current medications, past family history, past medical history, past social history, past surgical history and problem list.   Objective:   Vitals:   02/09/21 1049  BP: 104/66  Pulse: 87  Weight: 135 lb (61.2 kg)    Fetal Status: Fetal Heart Rate (bpm): 134   Movement: Present     General:  Alert, oriented and cooperative. Patient is in no acute distress.  Skin: Skin is warm and dry. No rash noted.   Cardiovascular: Normal heart rate noted  Respiratory: Normal respiratory effort, no problems with respiration noted  Abdomen: Soft, gravid, appropriate for gestational age.  Pain/Pressure: Absent     Pelvic: Cervical exam deferred        Extremities: Normal range of motion.     Mental Status: Normal mood and affect. Normal behavior. Normal judgment and thought content.   Assessment and Plan:  Pregnancy: [id number] at [redacted]w[redacted]d 1. Supervision of high risk pregnancy, antepartum - Anatomy scan on 2/1 - Offered MSAFP, pt accepts  2. Tobacco abuse - She is working on [employer] - smokes 3-4 cpd, previously 5 cpd.  - She would like to try the patch.   3. History of IUFD - This was her second pregnancy that was at  23 wks and complicated by abruption.   4. History of preterm delivery, currently pregnant - 23w IUFD, otherwise only PTB was [redacted]w[redacted]d PROM. Has had subsequent full term deliveries  5. Unwanted fertility Sign tubal papers at 28w  6. History of gestational hypertension Continue ldASA Baseline labs wnl  7. Sickle cell trait ([place]) Recommended partner testing - pt she will discuss with him.   8. Prior pregnancy with placenta abruption, antepartum See above  9. Anemia of pregnancy in second trimester IV Venofer done 1/19  Preterm labor symptoms and general obstetric precautions including but not limited to vaginal bleeding, contractions, leaking of fluid and fetal movement were reviewed in detail with the patient. Please refer to After Visit Summary for other counseling recommendations.   Return in about 4 weeks (around 03/09/2021) for OB VISIT, MD only.  Future Appointments  Date Time Provider [place]  02/21/2021  8:15 AM [facility] NURSE [facility]  02/21/2021  8:30 AM [facility] US[facility]    Radene Gunning, MD

## 2021-02-09 ENCOUNTER — Encounter: Payer: Self-pay | Admitting: Obstetrics and Gynecology

## 2021-02-09 ENCOUNTER — Other Ambulatory Visit: Payer: Self-pay

## 2021-02-09 ENCOUNTER — Ambulatory Visit (INDEPENDENT_AMBULATORY_CARE_PROVIDER_SITE_OTHER): Payer: Managed Care, Other (non HMO) | Admitting: Obstetrics and Gynecology

## 2021-02-09 VITALS — BP 104/66 | HR 87 | Wt 135.0 lb

## 2021-02-09 DIAGNOSIS — Z720 Tobacco use: Secondary | ICD-10-CM | POA: Diagnosis not present

## 2021-02-09 DIAGNOSIS — O0992 Supervision of high risk pregnancy, unspecified, second trimester: Secondary | ICD-10-CM | POA: Diagnosis not present

## 2021-02-09 DIAGNOSIS — O09899 Supervision of other high risk pregnancies, unspecified trimester: Secondary | ICD-10-CM | POA: Diagnosis not present

## 2021-02-09 DIAGNOSIS — O09892 Supervision of other high risk pregnancies, second trimester: Secondary | ICD-10-CM

## 2021-02-09 DIAGNOSIS — Z8759 Personal history of other complications of pregnancy, childbirth and the puerperium: Secondary | ICD-10-CM | POA: Diagnosis not present

## 2021-02-09 DIAGNOSIS — Z3A17 17 weeks gestation of pregnancy: Secondary | ICD-10-CM

## 2021-02-09 DIAGNOSIS — O09299 Supervision of pregnancy with other poor reproductive or obstetric history, unspecified trimester: Secondary | ICD-10-CM

## 2021-02-09 DIAGNOSIS — O99332 Smoking (tobacco) complicating pregnancy, second trimester: Secondary | ICD-10-CM

## 2021-02-09 DIAGNOSIS — O0990 Supervision of high risk pregnancy, unspecified, unspecified trimester: Secondary | ICD-10-CM | POA: Diagnosis not present

## 2021-02-09 DIAGNOSIS — Z3009 Encounter for other general counseling and advice on contraception: Secondary | ICD-10-CM | POA: Diagnosis not present

## 2021-02-09 DIAGNOSIS — D573 Sickle-cell trait: Secondary | ICD-10-CM

## 2021-02-09 DIAGNOSIS — O99012 Anemia complicating pregnancy, second trimester: Secondary | ICD-10-CM

## 2021-02-09 DIAGNOSIS — F17210 Nicotine dependence, cigarettes, uncomplicated: Secondary | ICD-10-CM

## 2021-02-09 DIAGNOSIS — O09292 Supervision of pregnancy with other poor reproductive or obstetric history, second trimester: Secondary | ICD-10-CM

## 2021-02-09 MED ORDER — NICOTINE 7 MG/24HR TD PT24: 7.0000 mg | MEDICATED_PATCH | Freq: Every day | TRANSDERMAL | 0 refills | Status: DC

## 2021-02-09 NOTE — Addendum Note (Signed)
Addended by: Radene Gunning A on: 02/09/2021 11:23 AM   Modules accepted: Orders

## 2021-02-13 LAB — AFP, SERUM, OPEN SPINA BIFIDA
AFP MoM: 4.57
AFP Value: 224 ng/mL
Gest. Age on Collection Date: 17.3 weeks
Maternal Age At EDD: 34.4 yr
OSBR Risk 1 IN: 25
Test Results:: POSITIVE — AB
Weight: 135 [lb_av]

## 2021-02-14 ENCOUNTER — Encounter: Payer: Self-pay | Admitting: Obstetrics and Gynecology

## 2021-02-14 DIAGNOSIS — O280 Abnormal hematological finding on antenatal screening of mother: Secondary | ICD-10-CM | POA: Insufficient documentation

## 2021-02-21 ENCOUNTER — Ambulatory Visit: Payer: No Typology Code available for payment source

## 2021-03-07 ENCOUNTER — Ambulatory Visit: Payer: Commercial Managed Care - HMO | Admitting: *Deleted

## 2021-03-07 ENCOUNTER — Other Ambulatory Visit: Payer: Self-pay | Admitting: Obstetrics and Gynecology

## 2021-03-07 ENCOUNTER — Other Ambulatory Visit: Payer: Self-pay

## 2021-03-07 ENCOUNTER — Ambulatory Visit: Payer: Commercial Managed Care - HMO | Attending: Obstetrics and Gynecology

## 2021-03-07 VITALS — BP 134/76 | HR 91

## 2021-03-07 DIAGNOSIS — O09899 Supervision of other high risk pregnancies, unspecified trimester: Secondary | ICD-10-CM

## 2021-03-07 DIAGNOSIS — O0990 Supervision of high risk pregnancy, unspecified, unspecified trimester: Secondary | ICD-10-CM | POA: Insufficient documentation

## 2021-03-07 DIAGNOSIS — Z8759 Personal history of other complications of pregnancy, childbirth and the puerperium: Secondary | ICD-10-CM

## 2021-03-08 ENCOUNTER — Other Ambulatory Visit: Payer: Self-pay | Admitting: *Deleted

## 2021-03-08 DIAGNOSIS — Z8759 Personal history of other complications of pregnancy, childbirth and the puerperium: Secondary | ICD-10-CM

## 2021-03-08 DIAGNOSIS — R772 Abnormality of alphafetoprotein: Secondary | ICD-10-CM

## 2021-03-21 ENCOUNTER — Ambulatory Visit: Payer: Medicaid Other | Attending: Obstetrics and Gynecology

## 2021-03-21 ENCOUNTER — Ambulatory Visit: Payer: Medicaid Other

## 2021-03-27 ENCOUNTER — Ambulatory Visit: Payer: Commercial Managed Care - HMO | Attending: Obstetrics and Gynecology

## 2021-03-27 ENCOUNTER — Ambulatory Visit: Payer: Commercial Managed Care - HMO | Admitting: *Deleted

## 2021-03-27 ENCOUNTER — Other Ambulatory Visit: Payer: Self-pay

## 2021-03-27 VITALS — BP 121/69 | HR 89

## 2021-03-27 DIAGNOSIS — O09293 Supervision of pregnancy with other poor reproductive or obstetric history, third trimester: Secondary | ICD-10-CM | POA: Diagnosis not present

## 2021-03-27 DIAGNOSIS — O09213 Supervision of pregnancy with history of pre-term labor, third trimester: Secondary | ICD-10-CM

## 2021-03-27 DIAGNOSIS — Z8759 Personal history of other complications of pregnancy, childbirth and the puerperium: Secondary | ICD-10-CM

## 2021-03-27 DIAGNOSIS — R772 Abnormality of alphafetoprotein: Secondary | ICD-10-CM | POA: Diagnosis present

## 2021-03-27 DIAGNOSIS — Z3A32 32 weeks gestation of pregnancy: Secondary | ICD-10-CM

## 2021-03-27 DIAGNOSIS — O0990 Supervision of high risk pregnancy, unspecified, unspecified trimester: Secondary | ICD-10-CM | POA: Diagnosis present

## 2021-03-27 DIAGNOSIS — Z148 Genetic carrier of other disease: Secondary | ICD-10-CM

## 2021-03-27 DIAGNOSIS — Z363 Encounter for antenatal screening for malformations: Secondary | ICD-10-CM

## 2021-03-27 DIAGNOSIS — O289 Unspecified abnormal findings on antenatal screening of mother: Secondary | ICD-10-CM

## 2021-03-27 DIAGNOSIS — O09899 Supervision of other high risk pregnancies, unspecified trimester: Secondary | ICD-10-CM | POA: Insufficient documentation

## 2021-03-28 ENCOUNTER — Other Ambulatory Visit: Payer: Self-pay | Admitting: *Deleted

## 2021-03-28 DIAGNOSIS — Z8759 Personal history of other complications of pregnancy, childbirth and the puerperium: Secondary | ICD-10-CM

## 2021-03-28 DIAGNOSIS — R772 Abnormality of alphafetoprotein: Secondary | ICD-10-CM

## 2021-04-03 ENCOUNTER — Ambulatory Visit: Payer: No Typology Code available for payment source

## 2021-04-04 ENCOUNTER — Other Ambulatory Visit: Payer: Medicaid Other

## 2021-04-04 ENCOUNTER — Encounter: Payer: Medicaid Other | Admitting: Obstetrics and Gynecology

## 2021-04-11 ENCOUNTER — Other Ambulatory Visit: Payer: Medicaid Other

## 2021-04-11 ENCOUNTER — Encounter: Payer: Self-pay | Admitting: Family Medicine

## 2021-04-18 ENCOUNTER — Ambulatory Visit (INDEPENDENT_AMBULATORY_CARE_PROVIDER_SITE_OTHER): Payer: Managed Care, Other (non HMO) | Admitting: *Deleted

## 2021-04-18 ENCOUNTER — Ambulatory Visit (INDEPENDENT_AMBULATORY_CARE_PROVIDER_SITE_OTHER): Payer: Managed Care, Other (non HMO) | Admitting: Family Medicine

## 2021-04-18 ENCOUNTER — Ambulatory Visit: Payer: Self-pay

## 2021-04-18 VITALS — BP 126/74 | HR 99 | Wt 146.0 lb

## 2021-04-18 DIAGNOSIS — O99012 Anemia complicating pregnancy, second trimester: Secondary | ICD-10-CM | POA: Diagnosis not present

## 2021-04-18 DIAGNOSIS — O09899 Supervision of other high risk pregnancies, unspecified trimester: Secondary | ICD-10-CM

## 2021-04-18 DIAGNOSIS — O280 Abnormal hematological finding on antenatal screening of mother: Secondary | ICD-10-CM

## 2021-04-18 DIAGNOSIS — O0990 Supervision of high risk pregnancy, unspecified, unspecified trimester: Secondary | ICD-10-CM

## 2021-04-18 DIAGNOSIS — Z8759 Personal history of other complications of pregnancy, childbirth and the puerperium: Secondary | ICD-10-CM

## 2021-04-18 DIAGNOSIS — D573 Sickle-cell trait: Secondary | ICD-10-CM

## 2021-04-18 LAB — GLUCOSE, CAPILLARY: Glucose-Capillary: 93 mg/dL (ref 70–99)

## 2021-04-18 NOTE — Progress Notes (Signed)
Pt arrived for NST/BPP appt as scheduled however had to leave to pick up her child. She was rescheduled for tomorrow @ 11:15 am.  ?

## 2021-04-18 NOTE — Progress Notes (Signed)
? ?  PRENATAL VISIT NOTE ? ?Subjective:  ?Regina Newman is a 36 y.o. [phone] at 63w5dbeing seen today for ongoing prenatal care.  She is currently monitored for the following issues for this high-risk pregnancy and has Prior pregnancy with placenta abruption, antepartum; Previous child with HThor Dx @ age 36 Tobacco abuse; Supervision of high risk pregnancy, antepartum; History of IUFD; History of preterm delivery, currently pregnant; Unwanted fertility; History of gestational hypertension; Sickle cell trait ([place]; Anemia of pregnancy in second trimester; and Abnormal MSAFP (maternal serum alpha-fetoprotein), elevated on their problem list. ? ?Patient reports no complaints.  Contractions: Irritability. Vag. Bleeding: None.  Movement: Present. Denies leaking of fluid.  ? ?The following portions of the patient's history were reviewed and updated as appropriate: allergies, current medications, past family history, past medical history, past social history, past surgical history and problem list.  ? ?Objective:  ? ?Vitals:  ? 04/18/21 1423  ?BP: 126/74  ?Pulse: 99  ?Weight: 146 lb (66.2 kg)  ? ? ?Fetal Status: Fetal Heart Rate (bpm): 153   Movement: Present    ? ?General:  Alert, oriented and cooperative. Patient is in no acute distress.  ?Skin: Skin is warm and dry. No rash noted.   ?Cardiovascular: Normal heart rate noted  ?Respiratory: Normal respiratory effort, no problems with respiration noted  ?Abdomen: Soft, gravid, appropriate for gestational age.  Pain/Pressure: Present     ?Pelvic: Cervical exam deferred        ?Extremities: Normal range of motion.  Edema: Trace  ?Mental Status: Normal mood and affect. Normal behavior. Normal judgment and thought content.  ? ?Assessment and Plan:  ?Pregnancy: [id number] 31w5d1. Supervision of high risk pregnancy, antepartum ?FHT and FH normal. GAP in care. Needs 28 week labs. ?- CBC ?- HIV antibody (with reflex) ?- RPR ? ?2. Abnormal MSAFP (maternal serum  alpha-fetoprotein), elevated ? ?3. Anemia of pregnancy in second trimester ?S/p iron infusions. Missed 28 week appt - gap in care. Will check CBC today. ?- CBC ? ?4. History of gestational hypertension ?BP normal ? ?5. History of preterm delivery, currently pregnant ? ?6. History of IUFD ? ?7. Sickle cell trait ([place]? ? ?Preterm labor symptoms and general obstetric precautions including but not limited to vaginal bleeding, contractions, leaking of fluid and fetal movement were reviewed in detail with the patient. ?Please refer to After Visit Summary for other counseling recommendations.  ? ?No follow-ups on file. ? ?Future Appointments  ?Date Time Provider [place]?04/18/2021  3:15 PM [facility] NST [facility]  ?04/24/2021  3:15 PM [facility] NURSE [facility]  ?04/24/2021  3:30 PM [facility] US2 [facility] [place]? ? ?JaTruett MainlandDO ?

## 2021-04-19 ENCOUNTER — Other Ambulatory Visit: Payer: Medicaid Other

## 2021-04-19 LAB — CBC
Hematocrit: 26.7 % — ABNORMAL LOW (ref 34.0–46.6)
Hemoglobin: 8.5 g/dL — ABNORMAL LOW (ref 11.1–15.9)
MCH: 24.3 pg — ABNORMAL LOW (ref 26.6–33.0)
MCHC: 31.8 g/dL (ref 31.5–35.7)
MCV: 76 fL — ABNORMAL LOW (ref 79–97)
Platelets: 349 10*3/uL (ref 150–450)
RBC: 3.5 x10E6/uL — ABNORMAL LOW (ref 3.77–5.28)
RDW: 19.9 % — ABNORMAL HIGH (ref 11.7–15.4)
WBC: 8.8 10*3/uL (ref 3.4–10.8)

## 2021-04-19 LAB — HIV ANTIBODY (ROUTINE TESTING W REFLEX): HIV Screen 4th Generation wRfx: NONREACTIVE

## 2021-04-19 LAB — RPR: RPR Ser Ql: NONREACTIVE

## 2021-04-20 ENCOUNTER — Other Ambulatory Visit: Payer: Medicaid Other

## 2021-04-20 ENCOUNTER — Other Ambulatory Visit: Payer: Self-pay | Admitting: Family Medicine

## 2021-04-20 DIAGNOSIS — O99012 Anemia complicating pregnancy, second trimester: Secondary | ICD-10-CM

## 2021-04-22 NOTE — Progress Notes (Deleted)
? ?  PRENATAL VISIT NOTE ? ?Subjective:  ?Regina Newman is a 36 y.o. [phone] at 36w***d being seen today for ongoing prenatal care.  She is currently monitored for the following issues for this high-risk pregnancy and has Prior pregnancy with placenta abruption, antepartum; Previous child with [place]- Dx @ age 5; Tobacco abuse; Supervision of high risk pregnancy, antepartum; History of IUFD; History of preterm delivery, currently pregnant; Unwanted fertility; History of gestational hypertension; Sickle cell trait ([place]); Anemia of pregnancy in second trimester; and Abnormal MSAFP (maternal serum alpha-fetoprotein), elevated on their problem list. ? ?Patient reports {[id number]}.   .  .   . Denies leaking of fluid.  ? ?The following portions of the patient's history were reviewed and updated as appropriate: allergies, current medications, past family history, past medical history, past social history, past surgical history and problem list.  ? ?Objective:  ?There were no vitals filed for this visit. ? ?Fetal Status:          ? ?General:  Alert, oriented and cooperative. Patient is in no acute distress.  ?Skin: Skin is warm and dry. No rash noted.   ?Cardiovascular: Normal heart rate noted  ?Respiratory: Normal respiratory effort, no problems with respiration noted  ?Abdomen: Soft, gravid, appropriate for gestational age.        ?Pelvic: Cervical exam performed in the presence of a chaperone        ?Extremities: Normal range of motion.     ?Mental Status: Normal mood and affect. Normal behavior. Normal judgment and thought content.  ? ?Assessment and Plan:  ?Pregnancy: [id number] at 36w***d ?1. Tobacco abuse ?*** ? ?2. Supervision of high risk pregnancy, antepartum ?- GBS/GC/CT done ?- 1 hr done today *** ? ?3. History of IUFD ?*** ? ?4. History of preterm delivery, currently pregnant ? ? ?5. Unwanted fertility ?- Signed tubal papers today *** ? ?6. History of gestational hypertension ?- Reviewed ldASA *** ? ?7. Anemia of  pregnancy in second trimester ?- s/p IV iron. Discussed another round of infusions. Ordered by Dr. Nehemiah Settle 3/31.  ?- hgb improved to 8.5  ? ?8. Prior pregnancy with placenta abruption, antepartum ? ?9. Abnormal MSAFP (maternal serum alpha-fetoprotein), elevated ?- With new dating MSAFP not valid as she would have been past possible date for checking. Pt updated on this ? ?Preterm labor symptoms and general obstetric precautions including but not limited to vaginal bleeding, contractions, leaking of fluid and fetal movement were reviewed in detail with the patient. ?Please refer to After Visit Summary for other counseling recommendations.  ? ?No follow-ups on file. ? ?Future Appointments  ?Date Time Provider [place]  ?04/24/2021  8:50 AM [facility] LAB [facility]  ?04/24/2021  9:15 AM Radene Gunning, MD [facility]  ?04/24/2021  3:15 PM [facility] NURSE [facility]  ?04/24/2021  3:30 PM [facility] US2 [facility] [place]  ? ? ?Radene Gunning, MD ?

## 2021-04-23 ENCOUNTER — Other Ambulatory Visit: Payer: Self-pay

## 2021-04-23 ENCOUNTER — Telehealth: Payer: Self-pay | Admitting: *Deleted

## 2021-04-23 DIAGNOSIS — O0990 Supervision of high risk pregnancy, unspecified, unspecified trimester: Secondary | ICD-10-CM

## 2021-04-23 NOTE — Telephone Encounter (Addendum)
-----   Message from Truett Mainland, DO sent at 04/20/2021 12:54 PM EDT ----- ?Needs to be scheduled for venofer infusions '300mg'$  weekly x 3 weeks. Orders placed ? ?4/3  0915  Called pt and she did not answer. I left message stating that I am calling with test results and information from the doctor. Pt was requested to call back. Venofer infusions have been scheduled x3 @ [place] [place].  ? ?4/3  1547  Called pt again and she did not answer. Pt has appt tomorrow in office - note made to discuss Venofer Tx w/pt.  ?

## 2021-04-24 ENCOUNTER — Encounter: Payer: Medicaid Other | Admitting: Obstetrics and Gynecology

## 2021-04-24 ENCOUNTER — Ambulatory Visit: Payer: Medicaid Other

## 2021-04-24 ENCOUNTER — Other Ambulatory Visit: Payer: Medicaid Other

## 2021-04-24 ENCOUNTER — Ambulatory Visit: Payer: Medicaid Other | Attending: Obstetrics

## 2021-04-24 DIAGNOSIS — O0990 Supervision of high risk pregnancy, unspecified, unspecified trimester: Secondary | ICD-10-CM

## 2021-04-24 DIAGNOSIS — O09299 Supervision of pregnancy with other poor reproductive or obstetric history, unspecified trimester: Secondary | ICD-10-CM

## 2021-04-24 DIAGNOSIS — Z720 Tobacco use: Secondary | ICD-10-CM

## 2021-04-24 DIAGNOSIS — O09899 Supervision of other high risk pregnancies, unspecified trimester: Secondary | ICD-10-CM

## 2021-04-24 DIAGNOSIS — Z3009 Encounter for other general counseling and advice on contraception: Secondary | ICD-10-CM

## 2021-04-24 DIAGNOSIS — O99012 Anemia complicating pregnancy, second trimester: Secondary | ICD-10-CM

## 2021-04-24 DIAGNOSIS — Z8759 Personal history of other complications of pregnancy, childbirth and the puerperium: Secondary | ICD-10-CM

## 2021-04-24 DIAGNOSIS — O280 Abnormal hematological finding on antenatal screening of mother: Secondary | ICD-10-CM

## 2021-04-24 NOTE — Telephone Encounter (Signed)
Called pt; VM box full. MyChart message sent.  ?

## 2021-04-26 ENCOUNTER — Telehealth: Payer: Self-pay

## 2021-04-26 NOTE — Telephone Encounter (Signed)
Called Pt to ask her to come back and re-sign BTL due to incorrect Year , no answer, VM full. Will retry again later. ?

## 2021-05-01 ENCOUNTER — Telehealth: Payer: Self-pay

## 2021-05-01 NOTE — Telephone Encounter (Signed)
Called Pt to get her to come in and re-sign Tubal form, Pt advised that she was currently in [place] at [facility] with her baby & is not able to come to office.  ?

## 2021-05-04 NOTE — Telephone Encounter (Signed)
Error

## 2021-05-08 ENCOUNTER — Encounter (HOSPITAL_COMMUNITY): Payer: Medicaid Other

## 2021-05-15 ENCOUNTER — Inpatient Hospital Stay (HOSPITAL_COMMUNITY): Admission: RE | Admit: 2021-05-15 | Payer: Medicaid Other | Source: Ambulatory Visit

## 2021-05-22 ENCOUNTER — Encounter (HOSPITAL_COMMUNITY): Payer: Medicaid Other

## 2021-07-31 ENCOUNTER — Encounter: Payer: Self-pay | Admitting: Obstetrics and Gynecology

## 2021-07-31 ENCOUNTER — Ambulatory Visit: Payer: Commercial Managed Care - HMO | Admitting: Obstetrics and Gynecology

## 2021-07-31 NOTE — Progress Notes (Signed)
Patient did not keep her OBGYN appointment for 07/31/2021.  Durene Romans MD Attending Center for [employer])
